# Patient Record
Sex: Female | Born: 1990 | Race: Black or African American | Hispanic: No | Marital: Single | Smoking: Never smoker
Health system: Southern US, Community
[De-identification: ages and names within clinical notes are randomized; demographics above are authoritative.]

## PROBLEM LIST (undated history)

## (undated) ENCOUNTER — Emergency Department (HOSPITAL_COMMUNITY): Payer: BC Managed Care – PPO

## (undated) DIAGNOSIS — G610 Guillain-Barre syndrome: Secondary | ICD-10-CM

## (undated) DIAGNOSIS — K529 Noninfective gastroenteritis and colitis, unspecified: Secondary | ICD-10-CM

## (undated) DIAGNOSIS — F411 Generalized anxiety disorder: Secondary | ICD-10-CM

## (undated) DIAGNOSIS — K219 Gastro-esophageal reflux disease without esophagitis: Secondary | ICD-10-CM

## (undated) DIAGNOSIS — E041 Nontoxic single thyroid nodule: Secondary | ICD-10-CM

## (undated) HISTORY — DX: Noninfective gastroenteritis and colitis, unspecified: K52.9

## (undated) HISTORY — DX: Generalized anxiety disorder: F41.1

## (undated) HISTORY — PX: WISDOM TOOTH EXTRACTION: SHX21

## (undated) HISTORY — DX: Nontoxic single thyroid nodule: E04.1

---

## 2017-03-26 ENCOUNTER — Emergency Department (HOSPITAL_COMMUNITY)
Admission: EM | Admit: 2017-03-26 | Discharge: 2017-03-27 | Disposition: A | Discharge status: Home / Self Care | Payer: Medicare Other | Attending: Emergency Medicine | Admitting: Emergency Medicine

## 2017-03-26 ENCOUNTER — Encounter (HOSPITAL_COMMUNITY): Payer: Self-pay | Admitting: Emergency Medicine

## 2017-03-26 DIAGNOSIS — T7802XA Anaphylactic reaction due to shellfish (crustaceans), initial encounter: Secondary | ICD-10-CM | POA: Diagnosis not present

## 2017-03-26 DIAGNOSIS — T782XXA Anaphylactic shock, unspecified, initial encounter: Secondary | ICD-10-CM

## 2017-03-26 DIAGNOSIS — Z79899 Other long term (current) drug therapy: Secondary | ICD-10-CM | POA: Insufficient documentation

## 2017-03-26 DIAGNOSIS — T781XXA Other adverse food reactions, not elsewhere classified, initial encounter: Secondary | ICD-10-CM | POA: Diagnosis present

## 2017-03-26 DIAGNOSIS — Z91013 Allergy to seafood: Secondary | ICD-10-CM | POA: Insufficient documentation

## 2017-03-26 MED ORDER — FAMOTIDINE 20 MG PO TABS
40.0000 mg | ORAL_TABLET | Freq: Once | ORAL | Status: AC
  Administered 2017-03-26: 40 mg via ORAL
  Filled 2017-03-26: qty 2

## 2017-03-26 MED ORDER — PREDNISONE 20 MG PO TABS
60.0000 mg | ORAL_TABLET | Freq: Once | ORAL | Status: AC
  Administered 2017-03-26: 60 mg via ORAL
  Filled 2017-03-26: qty 3

## 2017-03-26 MED ORDER — DIPHENHYDRAMINE HCL 25 MG PO CAPS
25.0000 mg | ORAL_CAPSULE | Freq: Once | ORAL | Status: AC
  Administered 2017-03-26: 25 mg via ORAL
  Filled 2017-03-26: qty 1

## 2017-03-26 MED ORDER — EPINEPHRINE 0.3 MG/0.3ML IJ SOAJ
0.3000 mg | Freq: Once | INTRAMUSCULAR | Status: AC
  Administered 2017-03-26: 0.3 mg via INTRAMUSCULAR
  Filled 2017-03-26: qty 0.3

## 2017-03-26 NOTE — ED Provider Notes (Signed)
[facility]-EMERGENCY DEPT Provider Note   CSN: [id number] Arrival date & time: 03/26/17  2218     History   Chief Complaint Chief Complaint  Patient presents with  . Allergic Reaction    HPI Taylor Stevens is a 26 y.o. female.  HPI  26 yo F with no significant PMHx here with facial swelling, sore throat, and difficulty swallowing. Pt reportedly was eating at a seafood restaurant and was eating shellfish today. She has never had this before but has had shrimp previously. She reportedly began developing a red rash around her mouth while eating followed by nausea and a sensation that her mouth was swelling and "closing off." No wheezing or SOB. No difficulty swallowing though she does have fullness in her throat. No vomiting but some nausea. No diarrhea. No h/o anaphylaxis or similar events.  History reviewed. No pertinent past medical history.  There are no active problems to display for this patient.   History reviewed. No pertinent surgical history.  OB History    No data available       Home Medications    Prior to Admission medications   Medication Sig Start Date End Date Taking? Authorizing Provider  diphenhydrAMINE (BENADRYL) 25 MG tablet Take 1 tablet (25 mg total) by mouth every 6 (six) hours. 03/27/17 04/01/17  Shaune Pollack, MD  EPINEPHrine (EPIPEN 2-PAK) 0.3 mg/0.3 mL IJ SOAJ injection Inject 0.3 mLs (0.3 mg total) into the muscle once as needed (anaphylaxis). 03/27/17   Shaune Pollack, MD  predniSONE (DELTASONE) 20 MG tablet Take 2 tablets (40 mg total) by mouth daily. 03/27/17 04/01/17  Shaune Pollack, MD  ranitidine (ZANTAC) 150 MG capsule Take 1 capsule (150 mg total) by mouth 2 (two) times daily. 03/27/17 04/01/17  Shaune Pollack, MD    Family History History reviewed. No pertinent family history.  Social History Social History  Substance Use Topics  . Smoking status: Never Smoker  . Smokeless tobacco: Never Used  . Alcohol use No     Allergies     Shellfish allergy   Review of Systems Review of Systems  HENT: Positive for trouble swallowing.   Gastrointestinal: Positive for nausea.  Skin: Positive for rash.  All other systems reviewed and are negative.    Physical Exam Updated Vital Signs BP 129/80 (BP Location: Left Arm)   Pulse 99   Temp 98.8 F (37.1 C) (Oral)   Resp 14   Ht 5\' 2"  (1.575 m)   Wt 63.5 kg (140 lb)   LMP 03/12/2017   SpO2 100%   BMI 25.61 kg/m   Physical Exam  Constitutional: She is oriented to person, place, and time. She appears well-developed and well-nourished. No distress.  HENT:  Head: Normocephalic and atraumatic.  Moderate bilateral periorbital edema. Moderate tonsillar edema and swelling.  Eyes: Conjunctivae are normal.  Neck: Neck supple.  No stridor.  Cardiovascular: Normal rate, regular rhythm and normal heart sounds.  Exam reveals no friction rub.   No murmur heard. Pulmonary/Chest: Effort normal and breath sounds normal. No respiratory distress. She has no wheezes. She has no rales.  Abdominal: She exhibits no distension.  Musculoskeletal: She exhibits no edema.  Neurological: She is alert and oriented to person, place, and time. She exhibits normal muscle tone.  Skin: Skin is warm. Capillary refill takes less than 2 seconds.  Psychiatric: She has a normal mood and affect.  Nursing note and vitals reviewed.    ED Treatments / Results  Labs (all labs ordered are listed, but  only abnormal results are displayed) Labs Reviewed - No data to display  EKG  EKG Interpretation None       Radiology No results found.  Procedures .Critical Care Performed by: Shaune PollackISAACS, Aftan Vint Authorized by: Shaune PollackISAACS, Allahna Husband      (including critical care time)  CRITICAL CARE Performed by: Dollene Clevelandameron Isascs   Total critical care time: 35 minutes  Critical care time was exclusive of separately billable procedures and treating other patients.  Critical care was necessary to treat or  prevent imminent or life-threatening deterioration.  Critical care was time spent personally by me on the following activities: development of treatment plan with patient and/or surrogate as well as nursing, discussions with consultants, evaluation of patient's response to treatment, examination of patient, obtaining history from patient or surrogate, ordering and performing treatments and interventions, ordering and review of laboratory studies, ordering and review of radiographic studies, pulse oximetry and re-evaluation of patient's condition.    Medications Ordered in ED Medications  EPINEPHrine (EPI-PEN) injection 0.3 mg (0.3 mg Intramuscular Given 03/26/17 2357)  diphenhydrAMINE (BENADRYL) capsule 25 mg (25 mg Oral Given 03/26/17 2357)  famotidine (PEPCID) tablet 40 mg (40 mg Oral Given 03/26/17 2357)  predniSONE (DELTASONE) tablet 60 mg (60 mg Oral Given 03/26/17 2357)     Initial Impression / Assessment and Plan / ED Course  I have reviewed the triage vital signs and the nursing notes.  Pertinent labs & imaging results that were available during my care of the patient were reviewed by me and considered in my medical decision making (see chart for details).    26 yo F with PMHx as above here with oral swelling, mild nausea after eating shellfish. Suspect allergic reaction, possible early anaphylaxis due to shellfish exposure. Pt given IM epi, PO meds with improvement. Plan to monitor in ED for rebound, d/c with outpt follow-up. Discussed avoidance of shellfish with pt. Plan to monitor until 2-3 AM, d/c if improved and stable. Pt aware.   Final Clinical Impressions(s) / ED Diagnoses   Final diagnoses:  Anaphylaxis, initial encounter  Shellfish allergy    New Prescriptions New Prescriptions   DIPHENHYDRAMINE (BENADRYL) 25 MG TABLET    Take 1 tablet (25 mg total) by mouth every 6 (six) hours.   EPINEPHRINE (EPIPEN 2-PAK) 0.3 MG/0.3 ML IJ SOAJ INJECTION    Inject 0.3 mLs (0.3 mg  total) into the muscle once as needed (anaphylaxis).   PREDNISONE (DELTASONE) 20 MG TABLET    Take 2 tablets (40 mg total) by mouth daily.   RANITIDINE (ZANTAC) 150 MG CAPSULE    Take 1 capsule (150 mg total) by mouth 2 (two) times daily.     Shaune PollackIsaacs, Sherlock Nancarrow, MD 03/27/17 [phone]

## 2017-03-26 NOTE — ED Triage Notes (Signed)
Patient was eating shellfish. Patient started to feel her throat swell. Eyes are swelling. Patient has taken 50 mg of benadryl by mouth. Patient is not having trouble breathing.

## 2017-03-27 MED ORDER — DIPHENHYDRAMINE HCL 25 MG PO TABS: 25.0000 mg | ORAL_TABLET | Freq: Four times a day (QID) | ORAL | 0 refills | Status: DC

## 2017-03-27 MED ORDER — EPINEPHRINE 0.3 MG/0.3ML IJ SOAJ: 0.3000 mg | Freq: Once | INTRAMUSCULAR | 0 refills | Status: DC | PRN

## 2017-03-27 MED ORDER — PREDNISONE 20 MG PO TABS: 40.0000 mg | ORAL_TABLET | Freq: Every day | ORAL | 0 refills | Status: AC

## 2017-03-27 MED ORDER — RANITIDINE HCL 150 MG PO CAPS: 150.0000 mg | ORAL_CAPSULE | Freq: Two times a day (BID) | ORAL | 0 refills | Status: DC

## 2017-03-29 ENCOUNTER — Emergency Department (HOSPITAL_BASED_OUTPATIENT_CLINIC_OR_DEPARTMENT_OTHER)
Admission: EM | Admit: 2017-03-29 | Discharge: 2017-03-29 | Disposition: A | Discharge status: Home / Self Care | Payer: Medicare Other | Attending: Emergency Medicine | Admitting: Emergency Medicine

## 2017-03-29 ENCOUNTER — Encounter (HOSPITAL_BASED_OUTPATIENT_CLINIC_OR_DEPARTMENT_OTHER): Payer: Self-pay

## 2017-03-29 DIAGNOSIS — R070 Pain in throat: Secondary | ICD-10-CM | POA: Diagnosis present

## 2017-03-29 DIAGNOSIS — K208 Other esophagitis: Secondary | ICD-10-CM | POA: Insufficient documentation

## 2017-03-29 DIAGNOSIS — T50905A Adverse effect of unspecified drugs, medicaments and biological substances, initial encounter: Secondary | ICD-10-CM

## 2017-03-29 NOTE — ED Triage Notes (Addendum)
C/o feeling like "throat swelling after taking prednisone" 20 min PTA-pt NAD-taking prednisone for recent allergic reaction to shellfish

## 2017-03-29 NOTE — ED Provider Notes (Signed)
[facility]   CSN: [id number] Arrival date & time: 03/29/17  1703   By signing my name below, I, Taylor Stevens, attest that this documentation has been prepared under the direction and in the presence of Rolland PorterJames, Cleave Ternes, MD . Electronically Signed: Freida Busmaniana Stevens, Scribe. 03/29/2017. 5:30 PM.   History   Chief Complaint Chief Complaint  Patient presents with  . Oral Swelling    The history is provided by the patient. No language interpreter was used.     HPI Comments:  Taylor Stevens is a 26 y.o. female who presents to the Emergency Department complaining of mild discomfort  in her throat that began ~ 30 min PTA after she took a prednisone tablet today. She felt like the pill "got stuck" Pt was seen in the ED on 03/26/2017 for an allergic reaction. She presented at that time with trouble swallowing, rash, and nausea after eating shellfish. She was discharged with prednisone which she has been taking since discharge. She denies rash today and notes throat swelling has improved at this time. Pt has no other acute complaints or associated symptoms at this time.   History reviewed. No pertinent past medical history.  There are no active problems to display for this patient.   History reviewed. No pertinent surgical history.  OB History    No data available       Home Medications    Prior to Admission medications   Medication Sig Start Date End Date Taking? Authorizing Provider  diphenhydrAMINE (BENADRYL) 25 MG tablet Take 1 tablet (25 mg total) by mouth every 6 (six) hours. 03/27/17 04/01/17  Shaune PollackIsaacs, Cameron, MD  EPINEPHrine (EPIPEN 2-PAK) 0.3 mg/0.3 mL IJ SOAJ injection Inject 0.3 mLs (0.3 mg total) into the muscle once as needed (anaphylaxis). 03/27/17   Shaune PollackIsaacs, Cameron, MD  predniSONE (DELTASONE) 20 MG tablet Take 2 tablets (40 mg total) by mouth daily. 03/27/17 04/01/17  Shaune PollackIsaacs, Cameron, MD  ranitidine (ZANTAC) 150 MG capsule Take 1 capsule (150 mg total) by  mouth 2 (two) times daily. 03/27/17 04/01/17  Shaune PollackIsaacs, Cameron, MD    Family History No family history on file.  Social History Social History  Substance Use Topics  . Smoking status: Never Smoker  . Smokeless tobacco: Never Used  . Alcohol use No     Allergies   Shellfish allergy   Review of Systems Review of Systems  Constitutional: Negative for appetite change, chills, diaphoresis, fatigue and fever.  HENT: Negative for mouth sores, sore throat and trouble swallowing.        +Throat swelling  Eyes: Negative for visual disturbance.  Respiratory: Negative for cough, chest tightness, shortness of breath and wheezing.   Cardiovascular: Negative for chest pain.  Gastrointestinal: Negative for abdominal distention, abdominal pain, diarrhea, nausea and vomiting.  Endocrine: Negative for polydipsia, polyphagia and polyuria.  Genitourinary: Negative for dysuria, frequency and hematuria.  Musculoskeletal: Negative for gait problem.  Skin: Negative for color change, pallor and rash.  Neurological: Negative for dizziness, syncope, light-headedness and headaches.  Hematological: Does not bruise/bleed easily.  Psychiatric/Behavioral: Negative for behavioral problems and confusion.     Physical Exam Updated Vital Signs BP 112/73 (BP Location: Left Arm)   Pulse 90   Temp 98.3 F (36.8 C) (Oral)   Resp 18   Ht 5\' 2"  (1.575 m)   Wt 63.5 kg (140 lb)   LMP 03/12/2017   SpO2 99%   BMI 25.61 kg/m   Physical Exam  Constitutional: She is oriented  to person, place, and time. She appears well-developed and well-nourished. No distress.  HENT:  Head: Normocephalic.  Eyes: Conjunctivae are normal. Pupils are equal, round, and reactive to light. No scleral icterus.  Neck: Normal range of motion. Neck supple. No thyromegaly present.  Cardiovascular: Normal rate and regular rhythm.  Exam reveals no gallop and no friction rub.   No murmur heard. Pulmonary/Chest: Effort normal and breath  sounds normal. No respiratory distress. She has no wheezes. She has no rales.  Abdominal: Soft. Bowel sounds are normal. She exhibits no distension. There is no tenderness. There is no rebound.  Musculoskeletal: Normal range of motion.  Neurological: She is alert and oriented to person, place, and time.  Skin: Skin is warm and dry. No rash noted.  Psychiatric: She has a normal mood and affect. Her behavior is normal.     ED Treatments / Results  DIAGNOSTIC STUDIES:  Oxygen Saturation is 99% on RA, normal by my interpretation.    COORDINATION OF CARE:  5:30 PM Discussed treatment plan with pt at bedside and pt agreed to plan.  Labs (all labs ordered are listed, but only abnormal results are displayed) Labs Reviewed - No data to display  EKG  EKG Interpretation None       Radiology No results found.  Procedures Procedures (including critical care time)  Medications Ordered in ED Medications - No data to display   Initial Impression / Assessment and Plan / ED Course  I have reviewed the triage vital signs and the nursing notes.  Pertinent labs & imaging results that were available during my care of the patient were reviewed by me and considered in my medical decision making (see chart for details).     Normal exam. History c/w pill esophagitis.   Final Clinical Impressions(s) / ED Diagnoses   Final diagnoses:  Pill esophagitis    New Prescriptions New Prescriptions   No medications on file       Rolland Porter, MD 03/29/17 1742

## 2017-03-29 NOTE — Discharge Instructions (Signed)
Soft diet. Avoid chips, etc. Avoid salsa, and other acidic foods.  Stop prednisone.

## 2017-04-06 ENCOUNTER — Emergency Department (HOSPITAL_BASED_OUTPATIENT_CLINIC_OR_DEPARTMENT_OTHER)
Admission: EM | Admit: 2017-04-06 | Discharge: 2017-04-06 | Disposition: A | Discharge status: Home / Self Care | Payer: Medicare Other | Attending: Emergency Medicine | Admitting: Emergency Medicine

## 2017-04-06 ENCOUNTER — Encounter (HOSPITAL_BASED_OUTPATIENT_CLINIC_OR_DEPARTMENT_OTHER): Payer: Self-pay | Admitting: Emergency Medicine

## 2017-04-06 DIAGNOSIS — T7840XA Allergy, unspecified, initial encounter: Secondary | ICD-10-CM | POA: Diagnosis not present

## 2017-04-06 DIAGNOSIS — R21 Rash and other nonspecific skin eruption: Secondary | ICD-10-CM | POA: Diagnosis present

## 2017-04-06 NOTE — ED Provider Notes (Signed)
[facility]   CSN: [id number] Arrival date & time: 04/06/17  0901     History   Chief Complaint Chief Complaint  Patient presents with  . Rash    HPI Taylor Stevens is a 26 y.o. female.  HPI Patient presents to the emergency department with complaints of rash to bilateral arms as well as left upper eyelid.  She states this is been present since Monday.  There's been no progression.  No fevers or chills.  Some itching.  Mild swelling noted of the left periorbital region which is why she presented to the ER today.  No difficulty breathing.  No other complaints.   History reviewed. No pertinent past medical history.  There are no active problems to display for this patient.   History reviewed. No pertinent surgical history.  OB History    No data available       Home Medications    Prior to Admission medications   Medication Sig Start Date End Date Taking? Authorizing Provider  diphenhydrAMINE (BENADRYL) 25 MG tablet Take 1 tablet (25 mg total) by mouth every 6 (six) hours. 03/27/17 04/01/17  Shaune PollackIsaacs, Cameron, MD  EPINEPHrine (EPIPEN 2-PAK) 0.3 mg/0.3 mL IJ SOAJ injection Inject 0.3 mLs (0.3 mg total) into the muscle once as needed (anaphylaxis). 03/27/17   Shaune PollackIsaacs, Cameron, MD  ranitidine (ZANTAC) 150 MG capsule Take 1 capsule (150 mg total) by mouth 2 (two) times daily. 03/27/17 04/01/17  Shaune PollackIsaacs, Cameron, MD    Family History No family history on file.  Social History Social History  Substance Use Topics  . Smoking status: Never Smoker  . Smokeless tobacco: Never Used  . Alcohol use No     Allergies   Shellfish allergy   Review of Systems Review of Systems  All other systems reviewed and are negative.    Physical Exam Updated Vital Signs BP 99/65 (BP Location: Left Arm)   Pulse 87   Temp 98.2 F (36.8 C) (Oral)   Resp 16   Ht 5\' 2"  (1.575 m)   Wt 63.5 kg (140 lb)   LMP 03/12/2017   SpO2 100%   BMI 25.61 kg/m   Physical  Exam  Constitutional: She is oriented to person, place, and time. She appears well-developed and well-nourished.  HENT:  Head: Normocephalic.  Macular rash on left upper eyelid with mild swelling. EOM normal. Left eye and conjunctiva normal  Eyes: EOM are normal.  Neck: Normal range of motion.  Pulmonary/Chest: Effort normal.  Abdominal: She exhibits no distension.  Musculoskeletal: Normal range of motion.  Neurological: She is alert and oriented to person, place, and time.  Skin:  Small areas of flat macular rash on bilateral arms and left upper eyelid  Psychiatric: She has a normal mood and affect.  Nursing note and vitals reviewed.    ED Treatments / Results  Labs (all labs ordered are listed, but only abnormal results are displayed) Labs Reviewed - No data to display  EKG  EKG Interpretation None       Radiology No results found.  Procedures Procedures (including critical care time)  Medications Ordered in ED Medications - No data to display   Initial Impression / Assessment and Plan / ED Course  I have reviewed the triage vital signs and the nursing notes.  Pertinent labs & imaging results that were available during my care of the patient were reviewed by me and considered in my medical decision making (see chart for details).  Likely allergic in nature.  Recommend hydrocortisone cream.  Patient was likely rubbing the area at night last night which is why it is slightly more swollen.  No signs suggest periorbital cellulitis.  Doubt [facility] spotted fever.  Home with topical steroids  Final Clinical Impressions(s) / ED Diagnoses   Final diagnoses:  Allergic reaction, initial encounter    New Prescriptions New Prescriptions   No medications on file     Azalia Bilis, MD 04/06/17 [phone]

## 2017-04-06 NOTE — ED Triage Notes (Signed)
Pt c/o LT eye edema with redness and sts noticed vision in that eye seems blurry; also noticed several red, round spots on BUE; sts went hiking over the weekend and noticed these on Mon. Pt also c/o intermittent sharp RT rib pain x months.

## 2017-04-10 ENCOUNTER — Inpatient Hospital Stay: Payer: Medicare Other

## 2017-04-16 ENCOUNTER — Encounter (HOSPITAL_BASED_OUTPATIENT_CLINIC_OR_DEPARTMENT_OTHER): Payer: Self-pay

## 2017-04-16 ENCOUNTER — Emergency Department (HOSPITAL_BASED_OUTPATIENT_CLINIC_OR_DEPARTMENT_OTHER)
Admission: EM | Admit: 2017-04-16 | Discharge: 2017-04-16 | Disposition: A | Discharge status: Home / Self Care | Payer: Medicare Other | Attending: Emergency Medicine | Admitting: Emergency Medicine

## 2017-04-16 DIAGNOSIS — M545 Low back pain: Secondary | ICD-10-CM | POA: Diagnosis present

## 2017-04-16 DIAGNOSIS — S39012A Strain of muscle, fascia and tendon of lower back, initial encounter: Secondary | ICD-10-CM

## 2017-04-16 MED ORDER — NAPROXEN 500 MG PO TABS: 500.0000 mg | ORAL_TABLET | Freq: Two times a day (BID) | ORAL | 0 refills | Status: DC

## 2017-04-16 MED ORDER — CYCLOBENZAPRINE HCL 10 MG PO TABS: 10.0000 mg | ORAL_TABLET | Freq: Two times a day (BID) | ORAL | 0 refills | Status: DC | PRN

## 2017-04-16 NOTE — ED Triage Notes (Signed)
Pt reports left lower back pain since yesterday while at work. Pain with twisting and moving.

## 2017-04-16 NOTE — Discharge Instructions (Signed)
Naproxen as prescribed.  Flexeril as prescribed as needed for pain not relieved with naproxen.  Follow up with your primary Dr. if not improving in the next 1-2 weeks to discuss physical therapy or imaging studies.

## 2017-04-16 NOTE — ED Provider Notes (Signed)
[facility]   CSN: [id number] Arrival date & time: 04/16/17  1607     History   Chief Complaint Chief Complaint  Patient presents with  . Back Pain    HPI Taylor Stevens is a 26 y.o. female.  Patient is a 26 year old female with no significant past medical history presenting with complaints of low back pain. She reports lifting a heavy box yesterday at work and feeling a tear in her left lower back. She has been experiencing pain since. She denies any radiation of her pain into her legs. She denies any bowel or bladder complaints. She denies any numbness or tingling.   The history is provided by the patient.  Back Pain   This is a new problem. The current episode started yesterday. The problem occurs constantly. The problem has not changed since onset.The pain is associated with lifting heavy objects. The pain is present in the lumbar spine. The quality of the pain is described as stabbing. The pain does not radiate. The pain is moderate. The symptoms are aggravated by bending, twisting and certain positions. Pertinent negatives include no bowel incontinence, no bladder incontinence, no paresthesias, no tingling and no weakness. She has tried nothing for the symptoms. The treatment provided no relief.    History reviewed. No pertinent past medical history.  There are no active problems to display for this patient.   History reviewed. No pertinent surgical history.  OB History    No data available       Home Medications    Prior to Admission medications   Medication Sig Start Date End Date Taking? Authorizing Provider  diphenhydrAMINE (BENADRYL) 25 MG tablet Take 1 tablet (25 mg total) by mouth every 6 (six) hours. 03/27/17 04/01/17  Shaune Pollack, MD  EPINEPHrine (EPIPEN 2-PAK) 0.3 mg/0.3 mL IJ SOAJ injection Inject 0.3 mLs (0.3 mg total) into the muscle once as needed (anaphylaxis). 03/27/17   Shaune Pollack, MD  ranitidine (ZANTAC) 150 MG  capsule Take 1 capsule (150 mg total) by mouth 2 (two) times daily. 03/27/17 04/01/17  Shaune Pollack, MD    Family History No family history on file.  Social History Social History  Substance Use Topics  . Smoking status: Never Smoker  . Smokeless tobacco: Never Used  . Alcohol use No     Allergies   Shellfish allergy   Review of Systems Review of Systems  Gastrointestinal: Negative for bowel incontinence.  Genitourinary: Negative for bladder incontinence.  Musculoskeletal: Positive for back pain.  Neurological: Negative for tingling, weakness and paresthesias.  All other systems reviewed and are negative.    Physical Exam Updated Vital Signs BP 108/71 (BP Location: Left Arm)   Pulse 74   Temp 98.5 F (36.9 C) (Oral)   Resp 18   LMP 04/12/2017   SpO2 100%   Physical Exam  Constitutional: She is oriented to person, place, and time. She appears well-developed and well-nourished. No distress.  HENT:  Head: Normocephalic and atraumatic.  Neck: Normal range of motion. Neck supple.  Musculoskeletal:  There is tenderness to palpation in the soft tissues of the left lower lumbar region. There is no bony tenderness.  Neurological: She is alert and oriented to person, place, and time.  DTRs are 2+ and symmetrical in the patellar and Achilles tendons bilaterally. Strength is 5 out of 5 in both lower extremities. She is able to ambulate on heels and toes without difficulty.  Skin: Skin is warm and dry. She is not  diaphoretic.  Nursing note and vitals reviewed.    ED Treatments / Results  Labs (all labs ordered are listed, but only abnormal results are displayed) Labs Reviewed - No data to display  EKG  EKG Interpretation None       Radiology No results found.  Procedures Procedures (including critical care time)  Medications Ordered in ED Medications - No data to display   Initial Impression / Assessment and Plan / ED Course  I have reviewed the triage  vital signs and the nursing notes.  Pertinent labs & imaging results that were available during my care of the patient were reviewed by me and considered in my medical decision making (see chart for details).  No evidence for thought Aquinas or other emergent situation. She will be treated with anti-inflammatories, muscle relaxers, and follow-up as needed if not improving.  Final Clinical Impressions(s) / ED Diagnoses   Final diagnoses:  None    New Prescriptions New Prescriptions   No medications on file     Geoffery Lyonselo, Bertel Venard, MD 04/16/17 [phone]

## 2017-04-16 NOTE — ED Notes (Signed)
ED Provider at bedside. 

## 2017-05-15 ENCOUNTER — Emergency Department (HOSPITAL_BASED_OUTPATIENT_CLINIC_OR_DEPARTMENT_OTHER)
Admission: EM | Admit: 2017-05-15 | Discharge: 2017-05-15 | Disposition: A | Discharge status: Home / Self Care | Payer: Medicare Other | Attending: Emergency Medicine | Admitting: Emergency Medicine

## 2017-05-15 ENCOUNTER — Encounter (HOSPITAL_BASED_OUTPATIENT_CLINIC_OR_DEPARTMENT_OTHER): Payer: Self-pay | Admitting: *Deleted

## 2017-05-15 DIAGNOSIS — R0789 Other chest pain: Secondary | ICD-10-CM | POA: Insufficient documentation

## 2017-05-15 DIAGNOSIS — R079 Chest pain, unspecified: Secondary | ICD-10-CM | POA: Diagnosis present

## 2017-05-15 DIAGNOSIS — Z79899 Other long term (current) drug therapy: Secondary | ICD-10-CM | POA: Diagnosis not present

## 2017-05-15 DIAGNOSIS — R002 Palpitations: Secondary | ICD-10-CM | POA: Diagnosis not present

## 2017-05-15 MED ORDER — DOCUSATE SODIUM 100 MG PO CAPS: 100.0000 mg | ORAL_CAPSULE | Freq: Two times a day (BID) | ORAL | 0 refills | Status: DC

## 2017-05-15 MED ORDER — OMEPRAZOLE 20 MG PO CPDR: 20.0000 mg | DELAYED_RELEASE_CAPSULE | Freq: Every day | ORAL | 1 refills | Status: DC

## 2017-05-15 MED FILL — OMEPRAZOLE DR 20 MG CAPSULE: 20 | 30 days supply | Qty: 30 | Fill #0

## 2017-05-15 MED FILL — DOK 100 MG SOFTGEL: 100 | 50 days supply | Qty: 100 | Fill #0

## 2017-05-15 NOTE — ED Provider Notes (Signed)
[facility]   CSN: [id number] Arrival date & time: 05/15/17  1209     History   Chief Complaint Chief Complaint  Patient presents with  . Chest Pain    HPI Taylor Stevens is a 26 y.o. female.  HPI Patient has a number of different symptoms. She reports that she often believes this is due to her gas. She reports she is always been like that. She reports now though she notes that she gets pains on both sides of her lower chest and the back. They may migrate around. Sometimes she might get a sharp pain in her anterior chest. Other times pains might be somewhat up in the left upper quadrant or occasionally in the right upper quadrant. They're usually crampy or sharp in nature. Patient reports for 2 weeks she's been noting that sometimes her heart feels like it skips beats. It feels like he'll get an extra little jump in. No syncope or near syncope. No fever no cough. No lower extremity swelling. Patient denies constipation but does note that her bowel movement was so big the other day she had to push and it did hurt when it finally came out and there was a little bit of bright red blood. As far as the patient's diet is concerned, she reports she eats a little bit of everything. She reports she does eat a lot of spicy food. History reviewed. No pertinent past medical history.  There are no active problems to display for this patient.   History reviewed. No pertinent surgical history.  OB History    No data available       Home Medications    Prior to Admission medications   Medication Sig Start Date End Date Taking? Authorizing Provider  cyclobenzaprine (FLEXERIL) 10 MG tablet Take 1 tablet (10 mg total) by mouth 2 (two) times daily as needed for muscle spasms. 04/16/17   Geoffery Lyonselo, Douglas, MD  diphenhydrAMINE (BENADRYL) 25 MG tablet Take 1 tablet (25 mg total) by mouth every 6 (six) hours. 03/27/17 04/01/17  Shaune PollackIsaacs, Cameron, MD  docusate sodium (COLACE) 100 MG  capsule Take 1 capsule (100 mg total) by mouth every 12 (twelve) hours. 05/15/17   Arby BarrettePfeiffer, Mcdaniel Ohms, MD  EPINEPHrine (EPIPEN 2-PAK) 0.3 mg/0.3 mL IJ SOAJ injection Inject 0.3 mLs (0.3 mg total) into the muscle once as needed (anaphylaxis). 03/27/17   Shaune PollackIsaacs, Cameron, MD  naproxen (NAPROSYN) 500 MG tablet Take 1 tablet (500 mg total) by mouth 2 (two) times daily. 04/16/17   Geoffery Lyonselo, Douglas, MD  omeprazole (PRILOSEC) 20 MG capsule Take 1 capsule (20 mg total) by mouth daily. 05/15/17   Arby BarrettePfeiffer, Shahla Betsill, MD  ranitidine (ZANTAC) 150 MG capsule Take 1 capsule (150 mg total) by mouth 2 (two) times daily. 03/27/17 04/01/17  Shaune PollackIsaacs, Cameron, MD    Family History No family history on file.  Social History Social History  Substance Use Topics  . Smoking status: Never Smoker  . Smokeless tobacco: Never Used  . Alcohol use No     Allergies   Shellfish allergy   Review of Systems Review of Systems 10 Systems reviewed and are negative for acute change except as noted in the HPI.   Physical Exam Updated Vital Signs BP 106/79   Pulse 77   Temp 98.3 F (36.8 C) (Oral)   Resp 16   Ht 5\' 2"  (1.575 m)   Wt 63.5 kg (140 lb)   SpO2 100%   BMI 25.61 kg/m   Physical Exam  Constitutional: She is oriented to person, place, and time. She appears well-developed and well-nourished. No distress.  HENT:  Head: Normocephalic and atraumatic.  Mouth/Throat: Oropharynx is clear and moist.  Eyes: Conjunctivae and EOM are normal.  Neck: Neck supple.  Cardiovascular: Normal rate, regular rhythm, normal heart sounds and intact distal pulses.   No murmur heard. Pulmonary/Chest: Effort normal and breath sounds normal. No respiratory distress.  Abdominal: Soft. Bowel sounds are normal. She exhibits no distension. There is no tenderness. There is no guarding.  Musculoskeletal: Normal range of motion. She exhibits no edema or tenderness.  Neurological: She is alert and oriented to person, place, and time. No cranial  nerve deficit. She exhibits normal muscle tone. Coordination normal.  Skin: Skin is warm and dry.  Psychiatric: She has a normal mood and affect.  Nursing note and vitals reviewed.    ED Treatments / Results  Labs (all labs ordered are listed, but only abnormal results are displayed) Labs Reviewed - No data to display  EKG  EKG Interpretation  Date/Time:  Monday May 15 2017 12:22:25 EDT Ventricular Rate:  86 PR Interval:  186 QRS Duration: 64 QT Interval:  348 QTC Calculation: 416 R Axis:   71 Text Interpretation:  Normal sinus rhythm Normal ECG Confirmed by Arby Barrette [phone]) on 05/15/2017 1:11:44 PM       Radiology No results found.  Procedures Procedures (including critical care time)  Medications Ordered in ED Medications - No data to display   Initial Impression / Assessment and Plan / ED Course  I have reviewed the triage vital signs and the nursing notes.  Pertinent labs & imaging results that were available during my care of the patient were reviewed by me and considered in my medical decision making (see chart for details).      Final Clinical Impressions(s) / ED Diagnoses   Final diagnoses:  Atypical chest pain  Palpitation   Patient has multiple symptoms as outlined. At this time I have low suspicion for cardiac or pulmonary etiology. Cardiac exam is normal. Patient does perceive intermittent palpitation. Instructions are provided for dietary and lifestyle measures for palpitations. With the patient's variable intermittent upper abdominal and lower chest discomfort I suspect GERD or gastritis. I have counseled the patient on daily Prilosec). Also she may have constipation. Colace is advised. Patient is counseled to follow-up with a primary care physician. She reports she wishes to do this and will use referral number to find one. New Prescriptions New Prescriptions   DOCUSATE SODIUM (COLACE) 100 MG CAPSULE    Take 1 capsule (100 mg total) by  mouth every 12 (twelve) hours.   OMEPRAZOLE (PRILOSEC) 20 MG CAPSULE    Take 1 capsule (20 mg total) by mouth daily.     Arby Barrette, MD 05/15/17 1328

## 2017-05-15 NOTE — ED Triage Notes (Signed)
Fluttering in her chest x 2 weeks. States it is worse at night when she lays down.

## 2017-05-22 ENCOUNTER — Encounter (HOSPITAL_BASED_OUTPATIENT_CLINIC_OR_DEPARTMENT_OTHER): Payer: Self-pay

## 2017-05-22 ENCOUNTER — Emergency Department (HOSPITAL_BASED_OUTPATIENT_CLINIC_OR_DEPARTMENT_OTHER)
Admission: EM | Admit: 2017-05-22 | Discharge: 2017-05-22 | Disposition: A | Discharge status: Home / Self Care | Payer: Medicare Other | Attending: Emergency Medicine | Admitting: Emergency Medicine

## 2017-05-22 DIAGNOSIS — N760 Acute vaginitis: Secondary | ICD-10-CM | POA: Diagnosis not present

## 2017-05-22 DIAGNOSIS — B373 Candidiasis of vulva and vagina: Secondary | ICD-10-CM

## 2017-05-22 DIAGNOSIS — B379 Candidiasis, unspecified: Secondary | ICD-10-CM | POA: Diagnosis not present

## 2017-05-22 DIAGNOSIS — N898 Other specified noninflammatory disorders of vagina: Secondary | ICD-10-CM | POA: Diagnosis present

## 2017-05-22 DIAGNOSIS — B9689 Other specified bacterial agents as the cause of diseases classified elsewhere: Secondary | ICD-10-CM

## 2017-05-22 DIAGNOSIS — B3731 Acute candidiasis of vulva and vagina: Secondary | ICD-10-CM

## 2017-05-22 LAB — WET PREP, GENITAL
SPERM: NONE SEEN
TRICH WET PREP: NONE SEEN

## 2017-05-22 LAB — URINALYSIS, ROUTINE W REFLEX MICROSCOPIC
Bilirubin Urine: NEGATIVE
GLUCOSE, UA: NEGATIVE mg/dL
HGB URINE DIPSTICK: NEGATIVE
KETONES UR: NEGATIVE mg/dL
LEUKOCYTES UA: NEGATIVE
Nitrite: NEGATIVE
PH: 6 (ref 5.0–8.0)
PROTEIN: NEGATIVE mg/dL
Specific Gravity, Urine: 1.015 (ref 1.005–1.030)

## 2017-05-22 LAB — PREGNANCY, URINE: Preg Test, Ur: NEGATIVE

## 2017-05-22 MED ORDER — DIPHENHYDRAMINE HCL 50 MG/ML IJ SOLN
25.0000 mg | Freq: Once | INTRAMUSCULAR | Status: DC
  Filled 2017-05-22: qty 1

## 2017-05-22 MED ORDER — METOCLOPRAMIDE HCL 5 MG/ML IJ SOLN
10.0000 mg | Freq: Once | INTRAMUSCULAR | Status: DC
  Filled 2017-05-22: qty 2

## 2017-05-22 MED ORDER — METRONIDAZOLE 500 MG PO TABS: 500.0000 mg | ORAL_TABLET | Freq: Two times a day (BID) | ORAL | 0 refills | Status: AC

## 2017-05-22 MED ORDER — FLUCONAZOLE 50 MG PO TABS
150.0000 mg | ORAL_TABLET | Freq: Once | ORAL | Status: AC
  Administered 2017-05-22: 14:00:00 150 mg via ORAL
  Filled 2017-05-22: qty 1

## 2017-05-22 MED ORDER — SODIUM CHLORIDE 0.9 % IV BOLUS (SEPSIS)
500.0000 mL | Freq: Once | INTRAVENOUS | Status: AC
  Administered 2017-05-22: 500 mL via INTRAVENOUS

## 2017-05-22 MED FILL — metroNIDAZOLE 500 MG TABS: 500 | 7 days supply | Qty: 14 | Fill #0

## 2017-05-22 NOTE — ED Triage Notes (Signed)
C/o vaginal d/c x 1 week-NAD-steady gait 

## 2017-05-22 NOTE — Discharge Instructions (Signed)
As discussed, you will receive a call if any results return positive from culture. Avoid sexual intercourse for at least 7 days. Take your entire course of antibiotics even if you feel better. Follow-up with your primary care provider or GYN.   Return if you experience any new concerning symptoms in the meantime.

## 2017-05-22 NOTE — ED Provider Notes (Signed)
[facility]   CSN: [id number] Arrival date & time: 05/22/17  1159     History   Chief Complaint Chief Complaint  Patient presents with  . Vaginal Discharge    HPI Taylor Stevens is a 26 y.o. female presents with pruritus and vaginal discharge as main concern. She also reports progressive onset left-sided headache starting today and reports she does not normally have headaches and was concerned. She has not tried anything for this. It is sharp and intermittent. One week of white thick vaginal discharge. IUD in place. History of unprotected intercourse. She reports history of multiple infections BV. He has tried Monistat for 3 days without success. LMP 2 days ago Denies fever, chills, neck stiffness, blurry vision, dizziness, nausea, vomiting. HPI  History reviewed. No pertinent past medical history.  There are no active problems to display for this patient.   History reviewed. No pertinent surgical history.  OB History    No data available       Home Medications    Prior to Admission medications   Medication Sig Start Date End Date Taking? Authorizing Provider  diphenhydrAMINE (BENADRYL) 25 MG tablet Take 1 tablet (25 mg total) by mouth every 6 (six) hours. 03/27/17 04/01/17  Shaune Pollack, MD  docusate sodium (COLACE) 100 MG capsule Take 1 capsule (100 mg total) by mouth every 12 (twelve) hours. 05/15/17   Arby Barrette, MD  metroNIDAZOLE (FLAGYL) 500 MG tablet Take 1 tablet (500 mg total) by mouth 2 (two) times daily. 05/22/17 05/29/17  Georgiana Shore, PA-C  omeprazole (PRILOSEC) 20 MG capsule Take 1 capsule (20 mg total) by mouth daily. 05/15/17   Arby Barrette, MD    Family History No family history on file.  Social History Social History  Substance Use Topics  . Smoking status: Never Smoker  . Smokeless tobacco: Never Used  . Alcohol use No     Allergies   Shellfish allergy   Review of Systems Review of Systems    Constitutional: Negative for chills and fever.  HENT: Negative for facial swelling, sinus pain and sinus pressure.   Eyes: Negative for photophobia, redness and visual disturbance.  Respiratory: Negative for cough, shortness of breath, wheezing and stridor.   Cardiovascular: Negative for chest pain and palpitations.  Gastrointestinal: Negative for abdominal distention, abdominal pain, nausea and vomiting.  Genitourinary: Positive for vaginal discharge. Negative for difficulty urinating, dysuria, flank pain, frequency, hematuria, pelvic pain, urgency and vaginal pain.  Musculoskeletal: Negative for arthralgias, back pain, gait problem, joint swelling, myalgias, neck pain and neck stiffness.  Skin: Negative for color change, pallor and rash.  Neurological: Positive for light-headedness and headaches. Negative for dizziness, tremors, seizures, syncope, facial asymmetry, speech difficulty, weakness and numbness.  All other systems reviewed and are negative.    Physical Exam Updated Vital Signs BP 100/82   Pulse 75   Temp 98.7 F (37.1 C) (Oral)   Resp 16   Ht 5\' 2"  (1.575 m)   Wt 73.1 kg (161 lb 2 oz)   SpO2 100%   BMI 29.47 kg/m   Physical Exam  Constitutional: She is oriented to person, place, and time. She appears well-developed and well-nourished. No distress.  Afebrile, nontoxic-appearing, lying comfortably in bed distress.  HENT:  Head: Normocephalic and atraumatic.  Mouth/Throat: Oropharynx is clear and moist. No oropharyngeal exudate.  Eyes: Pupils are equal, round, and reactive to light. Conjunctivae and EOM are normal. Right eye exhibits no discharge. Left eye exhibits no discharge.  Neck: Normal range of motion. Neck supple.  Negative Meningeal signs  Cardiovascular: Normal rate, regular rhythm, normal heart sounds and intact distal pulses.   No murmur heard. Pulmonary/Chest: Effort normal and breath sounds normal. No respiratory distress. She has no wheezes. She has no  rales.  Abdominal: Soft. She exhibits no distension and no mass. There is no tenderness. There is no rebound and no guarding.  Genitourinary: Vaginal discharge found.  Genitourinary Comments: No cervical motion tenderness, no adnexal tenderness or mass, thick white discharge.   Musculoskeletal: Normal range of motion. She exhibits no edema.  Neurological: She is alert and oriented to person, place, and time. No cranial nerve deficit or sensory deficit. She exhibits normal muscle tone. Coordination normal.  Neurologic Exam:  - Mental status: Patient is alert and cooperative. Fluent speech and words are clear. Coherent thought processes and insight is good. Patient is oriented x 4 to person, place, time and event.  - Cranial nerves:  CN III, IV, VI: pupils equally round, reactive to light both direct and conscensual. Full extra-ocular movement. CN V: motor temporalis and masseter strength intact. CN VII : muscles of facial expression intact. CN X :  midline uvula. XI strength of sternocleidomastoid and trapezius muscles 5/5, XII: tongue is midline when protruded. - Motor: No involuntary movements. Muscle tone and bulk normal throughout. Muscle strength is 5/5 in bilateral shoulder abduction, elbow flexion and extension, grip, hip extension, flexion, leg flexion and extension, ankle dorsiflexion and plantar flexion.  - Sensory: Proprioception, light tough sensation intact in all extremities.  - Reflexes: patellar 2 + and equal bilaterally; toes down going bilaterally.  - Cerebellar: rapid alternating movements and point to point movement intact in upper and lower extremities. Normal stance and gait.  Skin: Skin is warm and dry. No rash noted. She is not diaphoretic. No erythema.  Psychiatric: She has a normal mood and affect.  Nursing note and vitals reviewed.    ED Treatments / Results  Labs (all labs ordered are listed, but only abnormal results are displayed) Labs Reviewed  WET PREP, GENITAL -  Abnormal; Notable for the following:       Result Value   Yeast Wet Prep HPF POC PRESENT (*)    Clue Cells Wet Prep HPF POC PRESENT (*)    WBC, Wet Prep HPF POC MODERATE (*)    All other components within normal limits  URINALYSIS, ROUTINE W REFLEX MICROSCOPIC - Abnormal; Notable for the following:    APPearance CLOUDY (*)    All other components within normal limits  PREGNANCY, URINE  RPR  HIV ANTIBODY (ROUTINE TESTING)  GC/CHLAMYDIA PROBE AMP ([place]) NOT AT [facility]    EKG  EKG Interpretation None       Radiology No results found.  Procedures Procedures (including critical care time)  Medications Ordered in ED Medications  metoCLOPramide (REGLAN) injection 10 mg (10 mg Intravenous Refused 05/22/17 1321)  diphenhydrAMINE (BENADRYL) injection 25 mg (25 mg Intravenous Refused 05/22/17 1322)  sodium chloride 0.9 % bolus 500 mL (0 mLs Intravenous Stopped 05/22/17 1506)  fluconazole (DIFLUCAN) tablet 150 mg (150 mg Oral Given 05/22/17 1429)     Initial Impression / Assessment and Plan / ED Course  I have reviewed the triage vital signs and the nursing notes.  Pertinent labs & imaging results that were available during my care of the patient were reviewed by me and considered in my medical decision making (see chart for details).    Patient presents with  a week of vaginal discharge and a headache since today. Normal neuro exam. Patient given migraine cocktail and on reassessment reported significant improvement.  Thick white cottage cheese type discharge on pelvic exam. No cervical motion tenderness or other concerning cervical discharge.   Wet prep with evidence of bacterial vaginosis and yeast.  Patient was given a one-time dose of fluconazole while in ED. She is otherwise well-appearing, afebrile, nontoxic exam and workup otherwise unremarkable. She improved while in the emergency department.  Discharge home with metronidazole, advised to avoid sexual intercourse  for at least 7 days. Explained to patient that she will get a call if any of her cultures return positive. No suggestion of current STI infection based on exam. Will hold antibiotics as needed if positive cultures.  Follow-up with PCP and/or for GYN. Discussed strict return precautions and advised to return to the emergency department if experiencing any new or worsening symptoms. Instructions were understood and patient agreed with discharge plan.  Final Clinical Impressions(s) / ED Diagnoses   Final diagnoses:  Vaginal yeast infection  BV (bacterial vaginosis)    New Prescriptions Discharge Medication List as of 05/22/2017  2:57 PM    START taking these medications   Details  metroNIDAZOLE (FLAGYL) 500 MG tablet Take 1 tablet (500 mg total) by mouth 2 (two) times daily., Starting Mon 05/22/2017, Until Mon 05/29/2017, Print         Mathews Robinsons B, PA-C 05/22/17 1759    Rolan Bucco, MD 05/23/17 [phone]

## 2017-05-23 LAB — GC/CHLAMYDIA PROBE AMP (~~LOC~~) NOT AT ARMC
Chlamydia: NEGATIVE
Neisseria Gonorrhea: NEGATIVE

## 2017-05-23 LAB — RPR: RPR: NONREACTIVE

## 2017-05-23 LAB — HIV ANTIBODY (ROUTINE TESTING W REFLEX): HIV SCREEN 4TH GENERATION: NONREACTIVE

## 2017-05-26 NOTE — ED Notes (Signed)
Pt. Called for results of her cultures.  Results given.  Pt. Verbalized understanding

## 2017-06-10 ENCOUNTER — Emergency Department (HOSPITAL_BASED_OUTPATIENT_CLINIC_OR_DEPARTMENT_OTHER)
Admission: EM | Admit: 2017-06-10 | Discharge: 2017-06-10 | Disposition: A | Discharge status: Home / Self Care | Payer: Medicare Other | Attending: Emergency Medicine | Admitting: Emergency Medicine

## 2017-06-10 ENCOUNTER — Encounter (HOSPITAL_BASED_OUTPATIENT_CLINIC_OR_DEPARTMENT_OTHER): Payer: Self-pay

## 2017-06-10 DIAGNOSIS — H6122 Impacted cerumen, left ear: Secondary | ICD-10-CM | POA: Diagnosis not present

## 2017-06-10 DIAGNOSIS — Z79899 Other long term (current) drug therapy: Secondary | ICD-10-CM | POA: Insufficient documentation

## 2017-06-10 DIAGNOSIS — H9312 Tinnitus, left ear: Secondary | ICD-10-CM | POA: Diagnosis present

## 2017-06-10 NOTE — ED Triage Notes (Signed)
Pt c/o muffled hearing in L ear, and tinnitus. Pt denies pain.

## 2017-06-10 NOTE — ED Provider Notes (Signed)
[facility]   CSN: [id number] Arrival date & time: 06/10/17  1545     History   Chief Complaint Chief Complaint  Patient presents with  . Tinnitus    HPI Taylor Stevens is a 26 y.o. female.  HPI  26 y.o. female, presents to the Emergency Department today due to left ear ringing. Denies pain. States she woke up with decreased hearing to left ear. Right ear is unchanged. No cough/congestion. No sinus pressure. No sick contacts. No fevers. No trouble swallowing. No discharge from ear. No bleeding. No other symptoms noted.    History reviewed. No pertinent past medical history.  There are no active problems to display for this patient.   History reviewed. No pertinent surgical history.  OB History    No data available       Home Medications    Prior to Admission medications   Medication Sig Start Date End Date Taking? Authorizing Provider  diphenhydrAMINE (BENADRYL) 25 MG tablet Take 1 tablet (25 mg total) by mouth every 6 (six) hours. 03/27/17 04/01/17  Shaune Pollack, MD  docusate sodium (COLACE) 100 MG capsule Take 1 capsule (100 mg total) by mouth every 12 (twelve) hours. 05/15/17   Arby Barrette, MD  omeprazole (PRILOSEC) 20 MG capsule Take 1 capsule (20 mg total) by mouth daily. 05/15/17   Arby Barrette, MD    Family History No family history on file.  Social History Social History  Substance Use Topics  . Smoking status: Never Smoker  . Smokeless tobacco: Never Used  . Alcohol use Yes     Comment: occasionally     Allergies   Shellfish allergy   Review of Systems Review of Systems ROS reviewed and all are negative for acute change except as noted in the HPI.  Physical Exam Updated Vital Signs BP 121/82 (BP Location: Left Arm)   Pulse 80   Temp 98.3 F (36.8 C) (Oral)   Resp 20   Ht  (1.575 m)   Wt 73 kg (161 lb)   LMP 04/24/2017   SpO2 100%   BMI 29.45 kg/m   Physical Exam  Constitutional: She is  oriented to person, place, and time. Vital signs are normal. She appears well-developed and well-nourished.  HENT:  Head: Normocephalic and atraumatic.  Right Ear: Hearing, tympanic membrane, external ear and ear canal normal.  Left Ear: Hearing, tympanic membrane and external ear normal.  Cerumen impaction noted left ear canal. No erythema. Non TTP  Eyes: Pupils are equal, round, and reactive to light. Conjunctivae and EOM are normal.  Cardiovascular: Normal rate and regular rhythm.   Pulmonary/Chest: Effort normal.  Neurological: She is alert and oriented to person, place, and time.  Skin: Skin is warm and dry.  Psychiatric: She has a normal mood and affect. Her speech is normal and behavior is normal. Thought content normal.  Nursing note and vitals reviewed.    ED Treatments / Results  Labs (all labs ordered are listed, but only abnormal results are displayed) Labs Reviewed - No data to display  EKG  EKG Interpretation None       Radiology No results found.  Procedures .Ear Cerumen Removal Date/Time: 06/10/2017 5:02 PM Performed by: Audry Pili Authorized by: Audry Pili   Consent:    Consent obtained:  Verbal   Consent given by:  Patient and parent   Risks discussed:  Bleeding, incomplete removal, dizziness and infection   Alternatives discussed:  No treatment Procedure details:  Location:  L ear   Procedure type: curette   Post-procedure details:    Inspection:  Bleeding   Hearing quality:  Normal   Patient tolerance of procedure:  Tolerated well, no immediate complications   (including critical care time)  Medications Ordered in ED Medications - No data to display   Initial Impression / Assessment and Plan / ED Course  I have reviewed the triage vital signs and the nursing notes.  Pertinent labs & imaging results that were available during my care of the patient were reviewed by me and considered in my medical decision making (see chart for  details).  Final Clinical Impressions(s) / ED Diagnoses     {I have reviewed the relevant previous healthcare records.  {I obtained HPI from historian.   ED Course:  Assessment: Pt is a 26 y.o. female presents to the Emergency Department today due to left ear ringing. Denies pain. States she woke up with decreased hearing to left ear. Right ear is unchanged. No cough/congestion. No sinus pressure. No sick contacts. No fevers. No trouble swallowing. No discharge from ear. No bleeding. On exam, pt in NAD. Nontoxic/nonseptic appearing. VSS. Afebrile. Cerumen impaction noted left auditory canal. Cleaned with curette. TM unermarkable. No signs of infection. Plan is to DC home with follow up to PCP. At time of discharge, Patient is in no acute distress. Vital Signs are stable. Patient is able to ambulate. Patient able to tolerate PO.   Disposition/Plan:  DC Home Additional Verbal discharge instructions given and discussed with patient.  Pt Instructed to f/u with PCP in the next week for evaluation and treatment of symptoms. Return precautions given Pt acknowledges and agrees with plan  Supervising Physician Arby BarrettePfeiffer, Marcy, MD  Final diagnoses:  Impacted cerumen of left ear    New Prescriptions New Prescriptions   No medications on file     Audry PiliMohr, Anterrio Mccleery, Cordelia Poche-C 06/10/17 1702    Arby BarrettePfeiffer, Marcy, MD 06/20/17 [phone]

## 2017-09-02 ENCOUNTER — Other Ambulatory Visit: Payer: Self-pay

## 2017-09-02 ENCOUNTER — Encounter (HOSPITAL_BASED_OUTPATIENT_CLINIC_OR_DEPARTMENT_OTHER): Payer: Self-pay | Admitting: Emergency Medicine

## 2017-09-02 ENCOUNTER — Emergency Department (HOSPITAL_BASED_OUTPATIENT_CLINIC_OR_DEPARTMENT_OTHER)
Admission: EM | Admit: 2017-09-02 | Discharge: 2017-09-02 | Disposition: A | Discharge status: Home / Self Care | Payer: Medicare Other | Attending: Emergency Medicine | Admitting: Emergency Medicine

## 2017-09-02 DIAGNOSIS — R109 Unspecified abdominal pain: Secondary | ICD-10-CM | POA: Diagnosis not present

## 2017-09-02 DIAGNOSIS — Z5321 Procedure and treatment not carried out due to patient leaving prior to being seen by health care provider: Secondary | ICD-10-CM | POA: Diagnosis not present

## 2017-09-02 DIAGNOSIS — R112 Nausea with vomiting, unspecified: Secondary | ICD-10-CM | POA: Insufficient documentation

## 2017-09-02 LAB — URINALYSIS, ROUTINE W REFLEX MICROSCOPIC
Bilirubin Urine: NEGATIVE
GLUCOSE, UA: NEGATIVE mg/dL
Ketones, ur: 15 mg/dL — AB
LEUKOCYTES UA: NEGATIVE
Nitrite: NEGATIVE
PROTEIN: NEGATIVE mg/dL
Specific Gravity, Urine: >1.03 — ABNORMAL HIGH (ref 1.005–1.030)
pH: 6 (ref 5.0–8.0)

## 2017-09-02 LAB — URINALYSIS, MICROSCOPIC (REFLEX): RBC / HPF: NONE SEEN RBC/hpf (ref 0–5)

## 2017-09-02 LAB — PREGNANCY, URINE: PREG TEST UR: NEGATIVE

## 2017-09-02 NOTE — ED Notes (Signed)
Pt leaving d/t no longer wants to wait

## 2017-09-02 NOTE — ED Triage Notes (Signed)
Patient reports abdominal pain which began 2 weeks ago.  Reports nausea, vomiting.  Denies diarrhea.

## 2017-09-03 ENCOUNTER — Emergency Department (HOSPITAL_BASED_OUTPATIENT_CLINIC_OR_DEPARTMENT_OTHER)
Admission: EM | Admit: 2017-09-03 | Discharge: 2017-09-03 | Disposition: A | Discharge status: Home / Self Care | Payer: Medicare Other | Attending: Emergency Medicine | Admitting: Emergency Medicine

## 2017-09-03 ENCOUNTER — Other Ambulatory Visit: Payer: Self-pay

## 2017-09-03 ENCOUNTER — Encounter (HOSPITAL_BASED_OUTPATIENT_CLINIC_OR_DEPARTMENT_OTHER): Payer: Self-pay | Admitting: *Deleted

## 2017-09-03 DIAGNOSIS — R109 Unspecified abdominal pain: Secondary | ICD-10-CM | POA: Diagnosis present

## 2017-09-03 DIAGNOSIS — R1110 Vomiting, unspecified: Secondary | ICD-10-CM | POA: Diagnosis not present

## 2017-09-03 DIAGNOSIS — R1013 Epigastric pain: Secondary | ICD-10-CM

## 2017-09-03 DIAGNOSIS — Z79899 Other long term (current) drug therapy: Secondary | ICD-10-CM | POA: Insufficient documentation

## 2017-09-03 LAB — URINALYSIS, ROUTINE W REFLEX MICROSCOPIC
BILIRUBIN URINE: NEGATIVE
Glucose, UA: NEGATIVE mg/dL
KETONES UR: 15 mg/dL — AB
LEUKOCYTES UA: NEGATIVE
NITRITE: NEGATIVE
Protein, ur: NEGATIVE mg/dL
Specific Gravity, Urine: >1.03 — ABNORMAL HIGH (ref 1.005–1.030)
pH: 6 (ref 5.0–8.0)

## 2017-09-03 LAB — CBC
HCT: 38 % (ref 36.0–46.0)
Hemoglobin: 13 g/dL (ref 12.0–15.0)
MCH: 33.1 pg (ref 26.0–34.0)
MCHC: 34.2 g/dL (ref 30.0–36.0)
MCV: 96.7 fL (ref 78.0–100.0)
PLATELETS: 224 10*3/uL (ref 150–400)
RBC: 3.93 MIL/uL (ref 3.87–5.11)
RDW: 11.2 % — AB (ref 11.5–15.5)
WBC: 6.3 10*3/uL (ref 4.0–10.5)

## 2017-09-03 LAB — COMPREHENSIVE METABOLIC PANEL
ALK PHOS: 31 U/L — AB (ref 38–126)
ALT: 12 U/L — AB (ref 14–54)
AST: 17 U/L (ref 15–41)
Albumin: 3.9 g/dL (ref 3.5–5.0)
Anion gap: 4 — ABNORMAL LOW (ref 5–15)
BILIRUBIN TOTAL: 0.8 mg/dL (ref 0.3–1.2)
BUN: 10 mg/dL (ref 6–20)
CALCIUM: 8.6 mg/dL — AB (ref 8.9–10.3)
CO2: 25 mmol/L (ref 22–32)
CREATININE: 0.58 mg/dL (ref 0.44–1.00)
Chloride: 106 mmol/L (ref 101–111)
Glucose, Bld: 89 mg/dL (ref 65–99)
Potassium: 3.3 mmol/L — ABNORMAL LOW (ref 3.5–5.1)
Sodium: 135 mmol/L (ref 135–145)
TOTAL PROTEIN: 6.9 g/dL (ref 6.5–8.1)

## 2017-09-03 LAB — PREGNANCY, URINE: Preg Test, Ur: NEGATIVE

## 2017-09-03 LAB — URINALYSIS, MICROSCOPIC (REFLEX)
RBC / HPF: NONE SEEN RBC/hpf (ref 0–5)
WBC UA: NONE SEEN WBC/hpf (ref 0–5)

## 2017-09-03 LAB — LIPASE, BLOOD: LIPASE: 28 U/L (ref 11–51)

## 2017-09-03 MED ORDER — OMEPRAZOLE 20 MG PO CPDR: 20.0000 mg | DELAYED_RELEASE_CAPSULE | Freq: Every day | ORAL | 0 refills | Status: DC

## 2017-09-03 NOTE — ED Provider Notes (Signed)
[facility] EMERGENCY DEPARTMENT Provider Note   CSN: [id number] Arrival date & time: 09/03/17  [id number]     History   Chief Complaint Chief Complaint  Patient presents with  . Abdominal Pain    HPI Hassan Bucklerrvanea Spenser is a 26 y.o. female.  Elba Barmanrvanea is a 26 year old female presenting with 1 month of abdominal pain described as abdominal fullness with occasional sharp pains in the central abdomen and on her left and right sides.  Has tried Pepto-Bismol and laxatives without any success.  Denies persistent nausea, vomiting or diarrhea however did have some vomiting yesterday that was nonbloody nonbilious.  Bowel movement was yesterday and feels like she has to go again right now.  Patient has IUD in place that is reportedly expired recently and this plan to get that out in the next week.  She is not currently sexually active with her boyfriend due to her discomfort.  Does drink alcohol about every other day but typically only 1-2 drinks.  she denies any paroxysmal pains, any fevers, chills or weight loss.      History reviewed. No pertinent past medical history.  There are no active problems to display for this patient.   History reviewed. No pertinent surgical history.  OB History    No data available       Home Medications    Prior to Admission medications   Medication Sig Start Date End Date Taking? Authorizing Provider  diphenhydrAMINE (BENADRYL) 25 MG tablet Take 1 tablet (25 mg total) by mouth every 6 (six) hours. 03/27/17 04/01/17  Shaune PollackIsaacs, Cameron, MD  docusate sodium (COLACE) 100 MG capsule Take 1 capsule (100 mg total) by mouth every 12 (twelve) hours. 05/15/17   Arby BarrettePfeiffer, Marcy, MD  omeprazole (PRILOSEC) 20 MG capsule Take 1 capsule (20 mg total) by mouth daily. 09/03/17   Renne MuscaWarden, Xan Ingraham L, MD    Family History History reviewed. No pertinent family history.  Social History Social History   Tobacco Use  . Smoking status: Never Smoker  . Smokeless tobacco:  Never Used  Substance Use Topics  . Alcohol use: Yes    Comment: occasionally  . Drug use: No     Allergies   Shellfish allergy   Review of Systems Review of Systems  Constitutional: Positive for appetite change. Negative for activity change, chills, diaphoresis, fatigue, fever and unexpected weight change.  HENT: Negative.   Eyes: Negative.   Respiratory: Negative.   Cardiovascular: Negative.   Gastrointestinal: Positive for abdominal pain and vomiting. Negative for blood in stool, constipation, diarrhea, nausea and rectal pain.  Endocrine: Negative.   Genitourinary: Negative.   Musculoskeletal: Negative.   Skin: Negative.   Neurological: Negative.      Physical Exam Updated Vital Signs BP 99/64 (BP Location: Right Arm)   Pulse 84   Temp 98.9 F (37.2 C) (Oral)   Resp 16   Ht 5\' 2"  (1.575 m)   Wt 68 kg (150 lb)   LMP 08/30/2017   SpO2 100%   BMI 27.44 kg/m   Physical Exam  Constitutional: She is oriented to person, place, and time. She appears well-developed and well-nourished. She does not appear ill. No distress.  HENT:  Head: Normocephalic and atraumatic.  Mouth/Throat: Oropharynx is clear and moist.  Eyes: EOM are normal. Pupils are equal, round, and reactive to light. No scleral icterus.  Cardiovascular: Normal rate, regular rhythm and normal heart sounds.  No murmur heard. Pulmonary/Chest: Effort normal and breath sounds normal. No respiratory distress.  Abdominal: Soft. Normal appearance. She exhibits no distension. Bowel sounds are increased. There is no hepatosplenomegaly. There is no tenderness. There is no CVA tenderness and negative Murphy's sign.  Neurological: She is alert and oriented to person, place, and time.  Skin: Skin is warm and dry. Capillary refill takes less than 2 seconds.     ED Treatments / Results  Labs (all labs ordered are listed, but only abnormal results are displayed) Labs Reviewed  URINALYSIS, ROUTINE W REFLEX  MICROSCOPIC - Abnormal; Notable for the following components:      Result Value   APPearance CLOUDY (*)    Specific Gravity, Urine >1.030 (*)    Hgb urine dipstick SMALL (*)    Ketones, ur 15 (*)    All other components within normal limits  URINALYSIS, MICROSCOPIC (REFLEX) - Abnormal; Notable for the following components:   Bacteria, UA MANY (*)    Squamous Epithelial / LPF 6-30 (*)    All other components within normal limits  COMPREHENSIVE METABOLIC PANEL - Abnormal; Notable for the following components:   Potassium 3.3 (*)    Calcium 8.6 (*)    ALT 12 (*)    Alkaline Phosphatase 31 (*)    Anion gap 4 (*)    All other components within normal limits  CBC - Abnormal; Notable for the following components:   RDW 11.2 (*)    All other components within normal limits  PREGNANCY, URINE  LIPASE, BLOOD    EKG  EKG Interpretation None       Radiology No results found.  Procedures Procedures (including critical care time)  Medications Ordered in ED Medications - No data to display   Initial Impression / Assessment and Plan / ED Course  I have reviewed the triage vital signs and the nursing notes.  Pertinent labs & imaging results that were available during my care of the patient were reviewed by me and considered in my medical decision making (see chart for details).  26 year old female one month of abdominal pain with no red flag signs or symptoms and negative pregnancy test.  History significant for early satiety, feelings of bloating may suggest peptic ulcer disease or GERD and less likely to be more significant pathology, negative Murphy sign and no abdominal tenderness. Upon further questioning patient reports drinking 3-4 shots of alcohol every other day. Given moderate alcohol use lipase was checked which was normal. Additionally, LFTs were checked to rule out current gallbladder pathology and these were also normal.  Unclear etiology at this time. Recommending trial of  PPI x1 month and following up with PCP. Return precautions discussed with patient.   Final Clinical Impressions(s) / ED Diagnoses   Final diagnoses:  Epigastric pain    ED Discharge Orders        Ordered    omeprazole (PRILOSEC) 20 MG capsule  Daily     09/03/17 1400     Mihaela Fajardo L. Myrtie SomanWarden, MD [facility] Resident PGY-2 09/03/2017 2:00 PM      Renne MuscaWarden, Aryelle Figg L, MD 09/03/17 1400    Little, Ambrose Finlandachel Morgan, MD 09/07/17 1026

## 2017-09-03 NOTE — ED Triage Notes (Signed)
Pt c/o epigastric abd pain x 67month

## 2017-09-03 NOTE — Discharge Instructions (Signed)
Taylor Stevens, I am unsure what is causing your abdominal pain at this time. You do not have any signs or symptoms that make me think of serious things like cancer. At this time I would recommend trying a medication for acid reflux called omeprazole. I have given you a prescription.  Please take this medication daily for one month. Please follow up with your PCP.

## 2017-09-03 NOTE — ED Notes (Addendum)
Assumed care of patient from [place] Mountain[place]ophie, [place]. Pt resting quietly at this time. No distress. Reports 2 week history of epigastric pain, that has been intermittent. PT requesting Imaging.

## 2017-09-30 ENCOUNTER — Other Ambulatory Visit: Payer: Self-pay | Admitting: Family Medicine

## 2017-10-11 ENCOUNTER — Encounter (HOSPITAL_BASED_OUTPATIENT_CLINIC_OR_DEPARTMENT_OTHER): Payer: Self-pay | Admitting: *Deleted

## 2017-10-11 ENCOUNTER — Other Ambulatory Visit: Payer: Self-pay

## 2017-10-11 ENCOUNTER — Emergency Department (HOSPITAL_BASED_OUTPATIENT_CLINIC_OR_DEPARTMENT_OTHER)
Admission: EM | Admit: 2017-10-11 | Discharge: 2017-10-11 | Discharge status: Against Medical Advice | Payer: Medicare Other | Attending: Emergency Medicine | Admitting: Emergency Medicine

## 2017-10-11 DIAGNOSIS — R079 Chest pain, unspecified: Secondary | ICD-10-CM | POA: Diagnosis not present

## 2017-10-11 DIAGNOSIS — Z5321 Procedure and treatment not carried out due to patient leaving prior to being seen by health care provider: Secondary | ICD-10-CM | POA: Diagnosis not present

## 2017-10-11 HISTORY — DX: Gastro-esophageal reflux disease without esophagitis: K21.9

## 2017-10-11 NOTE — ED Notes (Signed)
Pt states she is leaving-advised to return prn-NAD

## 2017-10-11 NOTE — ED Triage Notes (Addendum)
Pt c/o epigastric / CP x 2 days described as burning.  pt states she has not been taking her prilosec

## 2017-12-30 ENCOUNTER — Other Ambulatory Visit: Payer: Self-pay

## 2017-12-30 ENCOUNTER — Emergency Department (HOSPITAL_BASED_OUTPATIENT_CLINIC_OR_DEPARTMENT_OTHER)
Admission: EM | Admit: 2017-12-30 | Discharge: 2017-12-30 | Disposition: A | Discharge status: Home / Self Care | Payer: Medicare Other | Attending: Emergency Medicine | Admitting: Emergency Medicine

## 2017-12-30 ENCOUNTER — Encounter (HOSPITAL_BASED_OUTPATIENT_CLINIC_OR_DEPARTMENT_OTHER): Payer: Self-pay | Admitting: Emergency Medicine

## 2017-12-30 DIAGNOSIS — Z79899 Other long term (current) drug therapy: Secondary | ICD-10-CM | POA: Diagnosis not present

## 2017-12-30 DIAGNOSIS — R197 Diarrhea, unspecified: Secondary | ICD-10-CM | POA: Diagnosis not present

## 2017-12-30 MED ORDER — ONDANSETRON HCL 4 MG PO TABS: 4.0000 mg | ORAL_TABLET | Freq: Three times a day (TID) | ORAL | 0 refills | Status: DC | PRN

## 2017-12-30 NOTE — ED Provider Notes (Signed)
[facility] EMERGENCY DEPARTMENT Provider Note   CSN: [id number] Arrival date & time: 12/30/17  1033     History   Chief Complaint Chief Complaint  Patient presents with  . Diarrhea    HPI Taylor Stevens is a 27 y.o. female presenting for evaluation of diarrhea.  Patient states for the past 3 days, she has had diarrhea.  She is having 2-5 episodes a day.  They are nonbloody and nonpainful.  She reports generalized abdominal irritation, but no pain or discomfort.  She has some nausea without vomiting.  Decreased appetite due to feeling poorly.  She denies fevers, chills, chest pain, shortness of breath, cough, or urinary symptoms.  She ate seafood the day before.  No one else around her is sick.  No recent travel.  No history of abdominal problems other than GERD.  No abdominal surgeries.  HPI  Past Medical History:  Diagnosis Date  . GERD (gastroesophageal reflux disease)     There are no active problems to display for this patient.   History reviewed. No pertinent surgical history.   OB History   None      Home Medications    Prior to Admission medications   Medication Sig Start Date End Date Taking? Authorizing Provider  diphenhydrAMINE (BENADRYL) 25 MG tablet Take 1 tablet (25 mg total) by mouth every 6 (six) hours. 03/27/17 04/01/17  Shaune Pollack, MD  docusate sodium (COLACE) 100 MG capsule Take 1 capsule (100 mg total) by mouth every 12 (twelve) hours. 05/15/17   Arby Barrette, MD  omeprazole (PRILOSEC) 20 MG capsule Take 1 capsule (20 mg total) by mouth daily. 09/03/17   Renne Musca, MD  ondansetron (ZOFRAN) 4 MG tablet Take 1 tablet (4 mg total) by mouth every 8 (eight) hours as needed for nausea or vomiting. 12/30/17   Vernadette Stutsman, PA-C    Family History No family history on file.  Social History Social History   Tobacco Use  . Smoking status: Never Smoker  . Smokeless tobacco: Never Used  Substance Use Topics  . Alcohol use: Yes     Comment: occasionally  . Drug use: No     Allergies   Shellfish allergy   Review of Systems Review of Systems  Gastrointestinal: Positive for diarrhea and nausea.  All other systems reviewed and are negative.    Physical Exam Updated Vital Signs BP 106/78 (BP Location: Right Arm)   Pulse 84   Temp 98.6 F (37 C) (Oral)   Resp 16   Ht 5\' 3"  (1.6 m)   Wt 68 kg (150 lb)   LMP 12/20/2017   SpO2 100%   BMI 26.57 kg/m   Physical Exam  Constitutional: She is oriented to person, place, and time. She appears well-developed and well-nourished. No distress.  Patient resting comfortably in bed in no apparent distress.  Does not appear dehydrated.  HENT:  Head: Normocephalic and atraumatic.  Mouth/Throat: Mucous membranes are normal.  Eyes: Pupils are equal, round, and reactive to light. Conjunctivae and EOM are normal.  Neck: Normal range of motion. Neck supple.  Cardiovascular: Normal rate, regular rhythm and intact distal pulses.  Pulmonary/Chest: Effort normal and breath sounds normal. No respiratory distress. She has no wheezes.  Abdominal: Soft. Bowel sounds are normal. She exhibits no distension and no mass. There is no tenderness. There is no guarding.  No tenderness to palpation of the abdomen.  Soft without rigidity, guarding, or distention.  Bowel sounds normoactive x4.  Musculoskeletal:  Normal range of motion.  Neurological: She is alert and oriented to person, place, and time.  Skin: Skin is warm and dry.  Psychiatric: She has a normal mood and affect.  Nursing note and vitals reviewed.    ED Treatments / Results  Labs (all labs ordered are listed, but only abnormal results are displayed) Labs Reviewed - No data to display  EKG None  Radiology No results found.  Procedures Procedures (including critical care time)  Medications Ordered in ED Medications - No data to display   Initial Impression / Assessment and Plan / ED Course  I have reviewed  the triage vital signs and the nursing notes.  Pertinent labs & imaging results that were available during my care of the patient were reviewed by me and considered in my medical decision making (see chart for details).     Patient presenting for evaluation of diarrhea.  Physical exam reassuring, she is afebrile not tachycardic.  She appears nontoxic.  She does not appear dehydrated, and abdominal exam is reassuring.  Likely gastroenteritis (? If 2/2 virus vs food, both usually self-limiting).  At this time, doubt intra-abdominal infection requiring surgery, perforation, or obstruction.  I do not believe labs or imaging would be beneficial at this time.  Offered Zofran and p.o. challenge, but patient states she would rather a prescription and be discharged.  At this time, patient appears safe for discharge.  Return precautions given.  Patient states she understands and agrees to plan.  Final Clinical Impressions(s) / ED Diagnoses   Final diagnoses:  Diarrhea, unspecified type    ED Discharge Orders        Ordered    ondansetron (ZOFRAN) 4 MG tablet  Every 8 hours PRN     12/30/17 1249       Nakira Litzau, PA-C 12/30/17 1356    Little, Ambrose Finlandachel Morgan, MD 01/01/18 1709

## 2017-12-30 NOTE — ED Triage Notes (Signed)
Diarrhea x 3 days, denies abd pain at present

## 2017-12-30 NOTE — Discharge Instructions (Signed)
Use Zofran as needed for nausea or vomiting. Make sure you are staying well-hydrated with liquids.  This is very important. Be careful with your food choices over the next several days, avoid greasy, fatty, dairy, or sugary foods. Return to the ER if you develop persistent vomiting despite medication, severe abdominal pain, or any new or concerning symptoms.

## 2018-07-12 ENCOUNTER — Encounter (HOSPITAL_BASED_OUTPATIENT_CLINIC_OR_DEPARTMENT_OTHER): Payer: Self-pay | Admitting: *Deleted

## 2018-07-12 ENCOUNTER — Other Ambulatory Visit: Payer: Self-pay

## 2018-07-12 ENCOUNTER — Emergency Department (HOSPITAL_BASED_OUTPATIENT_CLINIC_OR_DEPARTMENT_OTHER)
Admission: EM | Admit: 2018-07-12 | Discharge: 2018-07-12 | Discharge status: Against Medical Advice | Payer: Medicare Other | Attending: Emergency Medicine | Admitting: Emergency Medicine

## 2018-07-12 DIAGNOSIS — Z5321 Procedure and treatment not carried out due to patient leaving prior to being seen by health care provider: Secondary | ICD-10-CM | POA: Diagnosis not present

## 2018-07-12 DIAGNOSIS — H9201 Otalgia, right ear: Secondary | ICD-10-CM | POA: Diagnosis not present

## 2018-07-12 NOTE — ED Triage Notes (Signed)
Pt c/o right ear pain x 1 day

## 2018-09-23 ENCOUNTER — Encounter (HOSPITAL_BASED_OUTPATIENT_CLINIC_OR_DEPARTMENT_OTHER): Payer: Self-pay | Admitting: Emergency Medicine

## 2018-09-23 ENCOUNTER — Other Ambulatory Visit: Payer: Self-pay

## 2018-09-23 ENCOUNTER — Emergency Department (HOSPITAL_BASED_OUTPATIENT_CLINIC_OR_DEPARTMENT_OTHER): Payer: Medicare Other

## 2018-09-23 ENCOUNTER — Emergency Department (HOSPITAL_BASED_OUTPATIENT_CLINIC_OR_DEPARTMENT_OTHER)
Admission: EM | Admit: 2018-09-23 | Discharge: 2018-09-23 | Disposition: A | Discharge status: Home / Self Care | Payer: Medicare Other | Attending: Emergency Medicine | Admitting: Emergency Medicine

## 2018-09-23 DIAGNOSIS — K2970 Gastritis, unspecified, without bleeding: Secondary | ICD-10-CM | POA: Insufficient documentation

## 2018-09-23 DIAGNOSIS — Z79899 Other long term (current) drug therapy: Secondary | ICD-10-CM | POA: Diagnosis not present

## 2018-09-23 DIAGNOSIS — R002 Palpitations: Secondary | ICD-10-CM | POA: Insufficient documentation

## 2018-09-23 DIAGNOSIS — R079 Chest pain, unspecified: Secondary | ICD-10-CM | POA: Diagnosis present

## 2018-09-23 LAB — BASIC METABOLIC PANEL
ANION GAP: 7 (ref 5–15)
BUN: 16 mg/dL (ref 6–20)
CO2: 23 mmol/L (ref 22–32)
Calcium: 8.8 mg/dL — ABNORMAL LOW (ref 8.9–10.3)
Chloride: 104 mmol/L (ref 98–111)
Creatinine, Ser: 0.71 mg/dL (ref 0.44–1.00)
GFR calc Af Amer: >60 mL/min (ref >60)
GLUCOSE: 94 mg/dL (ref 70–99)
Potassium: 3.8 mmol/L (ref 3.5–5.1)
Sodium: 134 mmol/L — ABNORMAL LOW (ref 135–145)

## 2018-09-23 LAB — HEPATIC FUNCTION PANEL
ALT: 12 U/L (ref 0–44)
AST: 20 U/L (ref 15–41)
Albumin: 4.1 g/dL (ref 3.5–5.0)
Alkaline Phosphatase: 34 U/L — ABNORMAL LOW (ref 38–126)
BILIRUBIN DIRECT: 0.2 mg/dL (ref 0.0–0.2)
BILIRUBIN INDIRECT: 1.2 mg/dL — AB (ref 0.3–0.9)
BILIRUBIN TOTAL: 1.4 mg/dL — AB (ref 0.3–1.2)
Total Protein: 7.7 g/dL (ref 6.5–8.1)

## 2018-09-23 LAB — LIPASE, BLOOD: Lipase: 33 U/L (ref 11–51)

## 2018-09-23 LAB — CBC
HCT: 41.1 % (ref 36.0–46.0)
Hemoglobin: 13.3 g/dL (ref 12.0–15.0)
MCH: 31.9 pg (ref 26.0–34.0)
MCHC: 32.4 g/dL (ref 30.0–36.0)
MCV: 98.6 fL (ref 80.0–100.0)
PLATELETS: 221 10*3/uL (ref 150–400)
RBC: 4.17 MIL/uL (ref 3.87–5.11)
RDW: 11.5 % (ref 11.5–15.5)
WBC: 6.8 10*3/uL (ref 4.0–10.5)
nRBC: 0 % (ref 0.0–0.2)

## 2018-09-23 LAB — TROPONIN I

## 2018-09-23 LAB — PREGNANCY, URINE: PREG TEST UR: NEGATIVE

## 2018-09-23 MED ORDER — OMEPRAZOLE 20 MG PO CPDR: 20.0000 mg | DELAYED_RELEASE_CAPSULE | Freq: Every day | ORAL | 0 refills | Status: DC

## 2018-09-23 MED ORDER — ALUM & MAG HYDROXIDE-SIMETH 200-200-20 MG/5ML PO SUSP
30.0000 mL | Freq: Once | ORAL | Status: AC
  Administered 2018-09-23: 30 mL via ORAL
  Filled 2018-09-23: qty 30

## 2018-09-23 MED ORDER — LIDOCAINE VISCOUS HCL 2 % MT SOLN
15.0000 mL | Freq: Once | OROMUCOSAL | Status: AC
  Administered 2018-09-23: 15 mL via ORAL
  Filled 2018-09-23: qty 15

## 2018-09-23 NOTE — ED Triage Notes (Signed)
Patient here with fluttering in heart since this am. Pt father died unexpectedly and states she has not been taking it well.

## 2018-09-23 NOTE — ED Provider Notes (Signed)
[facility] EMERGENCY DEPARTMENT Provider Note   CSN: [id number] Arrival date & time: 09/23/18  1226     History   Chief Complaint Chief Complaint  Patient presents with  . Chest Pain  . Palpitations    HPI Taylor Stevens is a 27 y.o. female with a past medical history of GERD who presents to ED for evaluation of multiple complaints. Her first complaint is chest "not feeling right" since she woke up this morning.  Her father passed away unexpectedly 2 days ago and she is unsure if this is causing her to have this feeling.  She denies history of similar symptoms in the past.  States that the pain feels like a "cramp" and has been intermittent with no specific aggravating or alleviating factors.  She has not tried any medications to help with her symptoms.  Denies any pleuritic chest pain, shortness of breath, recent immobilization, leg swelling, OCP use, history of DVT, PE or MI. Her next complaint is 3-week history of abdominal cramping and "gurgling."  She notes that she takes NyQuil about 3 times a week to help her sleep.  Unsure if this is related to her symptoms.  She denies any nausea, vomiting, changes to bowel movements, fever, vaginal complaints.  HPI  Past Medical History:  Diagnosis Date  . GERD (gastroesophageal reflux disease)     There are no active problems to display for this patient.   History reviewed. No pertinent surgical history.   OB History   No obstetric history on file.      Home Medications    Prior to Admission medications   Medication Sig Start Date End Date Taking? Authorizing Provider  diphenhydrAMINE (BENADRYL) 25 MG tablet Take 1 tablet (25 mg total) by mouth every 6 (six) hours. 03/27/17 09/23/18 Yes Shaune Pollack, MD  docusate sodium (COLACE) 100 MG capsule Take 1 capsule (100 mg total) by mouth every 12 (twelve) hours. 05/15/17   Arby Barrette, MD  omeprazole (PRILOSEC) 20 MG capsule Take 1 capsule (20 mg total) by mouth  daily. 09/23/18   Debraann Livingstone, PA-C  ondansetron (ZOFRAN) 4 MG tablet Take 1 tablet (4 mg total) by mouth every 8 (eight) hours as needed for nausea or vomiting. 12/30/17   Caccavale, Sophia, PA-C    Family History History reviewed. No pertinent family history.  Social History Social History   Tobacco Use  . Smoking status: Never Smoker  . Smokeless tobacco: Never Used  Substance Use Topics  . Alcohol use: Yes    Comment: occasionally  . Drug use: No     Allergies   Shellfish allergy   Review of Systems Review of Systems  Constitutional: Negative for appetite change, chills and fever.  HENT: Negative for ear pain, rhinorrhea, sneezing and sore throat.   Eyes: Negative for photophobia and visual disturbance.  Respiratory: Negative for cough, chest tightness, shortness of breath and wheezing.   Cardiovascular: Positive for chest pain. Negative for palpitations.  Gastrointestinal: Positive for abdominal pain. Negative for blood in stool, constipation, diarrhea, nausea and vomiting.  Genitourinary: Negative for dysuria, hematuria and urgency.  Musculoskeletal: Negative for myalgias.  Skin: Negative for rash.  Neurological: Negative for dizziness, weakness and light-headedness.     Physical Exam Updated Vital Signs BP 111/76   Pulse 83   Temp 98.4 F (36.9 C) (Oral)   Resp 20   Ht 5\' 3"  (1.6 m)   Wt 68 kg   LMP 08/26/2018   SpO2 100%   BMI  26.57 kg/m   Physical Exam Vitals signs and nursing note reviewed.  Constitutional:      General: She is not in acute distress.    Appearance: She is well-developed.  HENT:     Head: Normocephalic and atraumatic.     Nose: Nose normal.  Eyes:     General: No scleral icterus.       Left eye: No discharge.     Conjunctiva/sclera: Conjunctivae normal.  Neck:     Musculoskeletal: Normal range of motion and neck supple.  Cardiovascular:     Rate and Rhythm: Normal rate and regular rhythm.     Heart sounds: Normal heart  sounds. No murmur. No friction rub. No gallop.   Pulmonary:     Effort: Pulmonary effort is normal. No respiratory distress.     Breath sounds: Normal breath sounds.  Abdominal:     General: Bowel sounds are normal. There is no distension.     Palpations: Abdomen is soft.     Tenderness: There is no abdominal tenderness. There is no guarding.  Musculoskeletal: Normal range of motion.  Skin:    General: Skin is warm and dry.     Findings: No rash.  Neurological:     Mental Status: She is alert.     Motor: No abnormal muscle tone.     Coordination: Coordination normal.      ED Treatments / Results  Labs (all labs ordered are listed, but only abnormal results are displayed) Labs Reviewed  BASIC METABOLIC PANEL - Abnormal; Notable for the following components:      Result Value   Sodium 134 (*)    Calcium 8.8 (*)    All other components within normal limits  HEPATIC FUNCTION PANEL - Abnormal; Notable for the following components:   Alkaline Phosphatase 34 (*)    Total Bilirubin 1.4 (*)    Indirect Bilirubin 1.2 (*)    All other components within normal limits  PREGNANCY, URINE  CBC  TROPONIN I  LIPASE, BLOOD    EKG EKG Interpretation  Date/Time:  Sunday September 23 2018 12:32:51 EST Ventricular Rate:  89 PR Interval:  172 QRS Duration: 70 QT Interval:  336 QTC Calculation: 408 R Axis:   80 Text Interpretation:  Normal sinus rhythm Normal ECG no change from previous Confirmed by Arby BarrettePfeiffer, Marcy [phone]([id number] on 09/23/2018 12:42:33 PM Also confirmed by Arby BarrettePfeiffer, Marcy [phone]), editor Barbette Hairassel, Kerry [phone])  on 09/23/2018 3:09:09 PM   Radiology Dg Chest 2 View  Result Date: 09/23/2018 CLINICAL DATA:  Chest pain beginning this morning. Gastroesophageal reflux disease. EXAM: CHEST - 2 VIEW COMPARISON:  None. FINDINGS: The heart size and mediastinal contours are within normal limits. Both lungs are clear. The visualized skeletal structures are unremarkable. IMPRESSION:  Negative.  No active cardiopulmonary disease. Electronically Signed   By: Myles RosenthalJohn  Stahl M.D.   On: 09/23/2018 13:05    Procedures Procedures (including critical care time)  Medications Ordered in ED Medications  alum & mag hydroxide-simeth (MAALOX/MYLANTA) 200-200-20 MG/5ML suspension 30 mL (30 mLs Oral Given 09/23/18 1457)    And  lidocaine (XYLOCAINE) 2 % viscous mouth solution 15 mL (15 mLs Oral Given 09/23/18 1457)     Initial Impression / Assessment and Plan / ED Course  I have reviewed the triage vital signs and the nursing notes.  Pertinent labs & imaging results that were available during my care of the patient were reviewed by me and considered in my medical decision making (see chart for  details).     27 year old female presents to ED for cramping feeling in her chest and abdomen. The sensation in her chest began this morning and but the abdominal cramping began 3 weeks ago.  Denies any pleuritic chest pain, shortness of breath, recent immobilization, OCP use, leg swelling, fever changes to bowel movements.  On exam she is overall well-appearing.  No lower extremity edema, erythema that would concern me for DVT.  Abdomen is soft, nontender nondistended.  Vital signs are within normal limits.  Plan is to obtain lab work, give GI cocktail and reassess.  Lab work including CBC, CMP, lipase, troponin unremarkable.  Pregnancy test is negative.  Chest x-ray is unremarkable.  EKG shows normal sinus rhythm.  Patient reports improvement in her symptoms with GI cocktail.  She is able to tolerate p.o. intake without difficulty prior to discharge.  Suspect that symptoms are due to gastritis.  She is PERC negative and low risk by heart score.  Will advise her to follow-up with her PCP and to return to ED for any severe worsening symptoms.  Patient is hemodynamically stable, in NAD, and able to ambulate in the ED. Evaluation does not show pathology that would require ongoing emergent intervention  or inpatient treatment. I explained the diagnosis to the patient. Pain has been managed and has no complaints prior to discharge. Patient is comfortable with above plan and is stable for discharge at this time. All questions were answered prior to disposition. Strict return precautions for returning to the ED were discussed. Encouraged follow up with PCP.    Portions of this note were generated with Scientist, clinical (histocompatibility and immunogenetics)Dragon dictation software. Dictation errors may occur despite best attempts at proofreading.  Final Clinical Impressions(s) / ED Diagnoses   Final diagnoses:  Gastritis without bleeding, unspecified chronicity, unspecified gastritis type    ED Discharge Orders         Ordered    omeprazole (PRILOSEC) 20 MG capsule  Daily     09/23/18 1633           Dietrich PatesKhatri, Draysen Weygandt, PA-C 09/23/18 1633    Arby BarrettePfeiffer, Marcy, MD 09/24/18 1545

## 2018-09-23 NOTE — ED Notes (Signed)
Pt talking on cell phone. NAD. Juice given with verbal approval from PA

## 2018-09-23 NOTE — Discharge Instructions (Signed)
Return to ED for worsening symptoms, worsening chest pain abdominal pain, vomiting or coughing up blood, leg swelling.

## 2018-09-23 NOTE — ED Notes (Signed)
Attempted IV to RAC, tol well, blood collected for labs

## 2018-11-22 ENCOUNTER — Emergency Department (HOSPITAL_BASED_OUTPATIENT_CLINIC_OR_DEPARTMENT_OTHER)
Admission: EM | Admit: 2018-11-22 | Discharge: 2018-11-22 | Disposition: A | Discharge status: Home / Self Care | Payer: Medicare Other | Attending: Emergency Medicine | Admitting: Emergency Medicine

## 2018-11-22 ENCOUNTER — Other Ambulatory Visit: Payer: Self-pay

## 2018-11-22 ENCOUNTER — Encounter (HOSPITAL_BASED_OUTPATIENT_CLINIC_OR_DEPARTMENT_OTHER): Payer: Self-pay | Admitting: *Deleted

## 2018-11-22 DIAGNOSIS — N898 Other specified noninflammatory disorders of vagina: Secondary | ICD-10-CM | POA: Insufficient documentation

## 2018-11-22 DIAGNOSIS — Z202 Contact with and (suspected) exposure to infections with a predominantly sexual mode of transmission: Secondary | ICD-10-CM | POA: Diagnosis present

## 2018-11-22 DIAGNOSIS — B9689 Other specified bacterial agents as the cause of diseases classified elsewhere: Secondary | ICD-10-CM | POA: Diagnosis not present

## 2018-11-22 DIAGNOSIS — Z79899 Other long term (current) drug therapy: Secondary | ICD-10-CM | POA: Diagnosis not present

## 2018-11-22 DIAGNOSIS — N760 Acute vaginitis: Secondary | ICD-10-CM | POA: Insufficient documentation

## 2018-11-22 LAB — URINALYSIS, MICROSCOPIC (REFLEX): WBC, UA: NONE SEEN WBC/hpf (ref 0–5)

## 2018-11-22 LAB — URINALYSIS, ROUTINE W REFLEX MICROSCOPIC
Bilirubin Urine: NEGATIVE
Glucose, UA: NEGATIVE mg/dL
Ketones, ur: 40 mg/dL — AB
Leukocytes,Ua: NEGATIVE
Nitrite: NEGATIVE
Protein, ur: NEGATIVE mg/dL
Specific Gravity, Urine: 1.025 (ref 1.005–1.030)
pH: 6 (ref 5.0–8.0)

## 2018-11-22 LAB — PREGNANCY, URINE: Preg Test, Ur: NEGATIVE

## 2018-11-22 LAB — WET PREP, GENITAL
Sperm: NONE SEEN
TRICH WET PREP: NONE SEEN
YEAST WET PREP: NONE SEEN

## 2018-11-22 MED ORDER — METRONIDAZOLE 500 MG PO TABS: 500.0000 mg | ORAL_TABLET | Freq: Two times a day (BID) | ORAL | 0 refills | Status: AC

## 2018-11-22 NOTE — ED Triage Notes (Signed)
Pt thinks that she has genital herpes.  States that she doesn't have an appointment with OBGYN for 2 weeks.

## 2018-11-22 NOTE — ED Notes (Signed)
Pt verbalizes understanding of d/c instructions and denies any further need at this time. 

## 2018-11-22 NOTE — ED Provider Notes (Signed)
[facility] EMERGENCY DEPARTMENT Provider Note   CSN: [id number] Arrival date & time: 11/22/18  1827    History   Chief Complaint Chief Complaint  Patient presents with  . SEXUALLY TRANSMITTED DISEASE    HPI Taylor Stevens is a 28 y.o. female.     The history is provided by the patient and medical records.  Female GU Problem  This is a new problem. The current episode started yesterday. The problem occurs constantly. The problem has not changed since onset.Pertinent negatives include no abdominal pain. Nothing aggravates the symptoms. Nothing relieves the symptoms. She has tried nothing for the symptoms. The treatment provided no relief.    Past Medical History:  Diagnosis Date  . GERD (gastroesophageal reflux disease)     There are no active problems to display for this patient.   History reviewed. No pertinent surgical history.   OB History   No obstetric history on file.      Home Medications    Prior to Admission medications   Medication Sig Start Date End Date Taking? Authorizing Provider  diphenhydrAMINE (BENADRYL) 25 MG tablet Take 1 tablet (25 mg total) by mouth every 6 (six) hours. 03/27/17 09/23/18  Shaune Pollack, MD  docusate sodium (COLACE) 100 MG capsule Take 1 capsule (100 mg total) by mouth every 12 (twelve) hours. 05/15/17   Arby Barrette, MD  metroNIDAZOLE (FLAGYL) 500 MG tablet Take 1 tablet (500 mg total) by mouth 2 (two) times daily for 7 days. 11/22/18 11/29/18  Ivor Kishi, DO  omeprazole (PRILOSEC) 20 MG capsule Take 1 capsule (20 mg total) by mouth daily. 09/23/18   Khatri, Hina, PA-C  ondansetron (ZOFRAN) 4 MG tablet Take 1 tablet (4 mg total) by mouth every 8 (eight) hours as needed for nausea or vomiting. 12/30/17   Caccavale, Sophia, PA-C    Family History History reviewed. No pertinent family history.  Social History Social History   Tobacco Use  . Smoking status: Never Smoker  . Smokeless tobacco: Never Used    Substance Use Topics  . Alcohol use: Yes    Comment: occasionally  . Drug use: No     Allergies   Shellfish allergy   Review of Systems Review of Systems  Constitutional: Negative for chills and fever.  Gastrointestinal: Negative for abdominal pain and vomiting.  Genitourinary: Positive for genital sores and vaginal discharge. Negative for decreased urine volume, difficulty urinating, dyspareunia, dysuria, enuresis, flank pain, frequency, hematuria, urgency, vaginal bleeding and vaginal pain.  Musculoskeletal: Negative for arthralgias and back pain.  Skin: Negative for color change and rash.  Neurological: Negative for seizures and syncope.  All other systems reviewed and are negative.    Physical Exam Updated Vital Signs BP 116/78 (BP Location: Right Arm)   Pulse 94   Temp 97.9 F (36.6 C) (Oral)   Resp 18   Ht 5\' 3"  (1.6 m)   Wt 68 kg   LMP 11/11/2018   SpO2 100%   BMI 26.57 kg/m   Physical Exam Exam conducted with a chaperone present.  Abdominal:     General: There is no distension.     Palpations: There is no mass.     Tenderness: There is no abdominal tenderness. There is no right CVA tenderness, left CVA tenderness or guarding.  Genitourinary:    Pubic Area: No rash or pubic lice.      Vagina: Vaginal discharge (scant) present.     Cervix: No cervical motion tenderness, friability or lesion.  Uterus: Normal.      Adnexa: Right adnexa normal.       Right: No mass, tenderness or fullness.         Left: No mass, tenderness or fullness.       Comments: 2 small bumps on the right lower labia area that is likely genital wart or ingrown hair, no ulcerative lesions     ED Treatments / Results  Labs (all labs ordered are listed, but only abnormal results are displayed) Labs Reviewed  WET PREP, GENITAL - Abnormal; Notable for the following components:      Result Value   Clue Cells Wet Prep HPF POC PRESENT (*)    WBC, Wet Prep HPF POC FEW (*)    All  other components within normal limits  URINALYSIS, ROUTINE W REFLEX MICROSCOPIC - Abnormal; Notable for the following components:   APPearance CLOUDY (*)    Hgb urine dipstick TRACE (*)    Ketones, ur 40 (*)    All other components within normal limits  URINALYSIS, MICROSCOPIC (REFLEX) - Abnormal; Notable for the following components:   Bacteria, UA RARE (*)    All other components within normal limits  PREGNANCY, URINE  GC/CHLAMYDIA PROBE AMP ([place]) NOT AT [facility]    EKG None  Radiology No results found.  Procedures Procedures (including critical care time)  Medications Ordered in ED Medications - No data to display   Initial Impression / Assessment and Plan / ED Course  I have reviewed the triage vital signs and the nursing notes.  Pertinent labs & imaging results that were available during my care of the patient were reviewed by me and considered in my medical decision making (see chart for details).        Taylor Stevens is a 28 year old female with no significant medical history who  Presents to the ED with concern for STD.  Patient with normal vitals.  No fever.  Patient concerned about 2 lesions on the right side of her labia that are not consistent with herpes.  Possible genital wart versus ingrown hair. Hx of HPV. No abscess.  No cellulitis.  Patient has had some scant vaginal discharge.  No concern for new STD with sex partner that she is monogamous with.  Does not want empiric treatment for gonorrhea/chlamydia. No concern for PID on exam. She was positive for bacterial vaginosis and treated with Flagyl.  Will follow-up gonorrhea and Chlamydia testing outpatient.  Does not have any trichomonas.  Will follow up with OB/GYN as well.  Discharged from ED in good condition and given return precautions.  Patient was negative for pregnancy test then no signs of urinary tract infection as well.  This chart was dictated using voice recognition software.  Despite best efforts  to proofread,  errors can occur which can change the documentation meaning.    Final Clinical Impressions(s) / ED Diagnoses   Final diagnoses:  BV (bacterial vaginosis)    ED Discharge Orders         Ordered    metroNIDAZOLE (FLAGYL) 500 MG tablet  2 times daily     11/22/18 1921           Virgina Norfolk, DO 11/22/18 1930

## 2018-11-26 LAB — GC/CHLAMYDIA PROBE AMP (~~LOC~~) NOT AT ARMC
Chlamydia: NEGATIVE
NEISSERIA GONORRHEA: NEGATIVE

## 2019-03-10 ENCOUNTER — Other Ambulatory Visit: Payer: Self-pay

## 2019-03-10 ENCOUNTER — Emergency Department (HOSPITAL_BASED_OUTPATIENT_CLINIC_OR_DEPARTMENT_OTHER)
Admission: EM | Admit: 2019-03-10 | Discharge: 2019-03-10 | Disposition: A | Discharge status: Home / Self Care | Payer: Medicare Other | Attending: Emergency Medicine | Admitting: Emergency Medicine

## 2019-03-10 ENCOUNTER — Encounter (HOSPITAL_BASED_OUTPATIENT_CLINIC_OR_DEPARTMENT_OTHER): Payer: Self-pay | Admitting: *Deleted

## 2019-03-10 DIAGNOSIS — H00015 Hordeolum externum left lower eyelid: Secondary | ICD-10-CM | POA: Insufficient documentation

## 2019-03-10 DIAGNOSIS — H5789 Other specified disorders of eye and adnexa: Secondary | ICD-10-CM | POA: Diagnosis present

## 2019-03-10 DIAGNOSIS — Z79899 Other long term (current) drug therapy: Secondary | ICD-10-CM | POA: Diagnosis not present

## 2019-03-10 HISTORY — DX: Guillain-Barre syndrome: G61.0

## 2019-03-10 NOTE — ED Provider Notes (Signed)
[facility] EMERGENCY DEPARTMENT Provider Note   CSN: [id number] Arrival date & time: 03/10/19  1744    History   Chief Complaint Chief Complaint  Patient presents with  . Eye Problem    HPI Taylor Stevens is a 28 y.o. female.     HPI Patient with swelling redness to the left lower eyelid for the last 2 days.  Denies any new drainage.  No visual changes.  Says she has had some "twitching" to the left eye for several months.  No fever or chills.  No known trauma. Past Medical History:  Diagnosis Date  . GERD (gastroesophageal reflux disease)   . Guillain Barr syndrome (H[facility])     There are no active problems to display for this patient.   History reviewed. No pertinent surgical history.   OB History   No obstetric history on file.      Home Medications    Prior to Admission medications   Medication Sig Start Date End Date Taking? Authorizing Provider  diphenhydrAMINE (BENADRYL) 25 MG tablet Take 1 tablet (25 mg total) by mouth every 6 (six) hours. 03/27/17 09/23/18  Shaune PollackIsaacs, Cameron, MD  docusate sodium (COLACE) 100 MG capsule Take 1 capsule (100 mg total) by mouth every 12 (twelve) hours. 05/15/17   Arby BarrettePfeiffer, Marcy, MD  omeprazole (PRILOSEC) 20 MG capsule Take 1 capsule (20 mg total) by mouth daily. 09/23/18   Khatri, Hina, PA-C  ondansetron (ZOFRAN) 4 MG tablet Take 1 tablet (4 mg total) by mouth every 8 (eight) hours as needed for nausea or vomiting. 12/30/17   Caccavale, Sophia, PA-C    Family History No family history on file.  Social History Social History   Tobacco Use  . Smoking status: Never Smoker  . Smokeless tobacco: Never Used  Substance Use Topics  . Alcohol use: Yes    Comment: occasionally  . Drug use: No     Allergies   Shellfish allergy   Review of Systems Review of Systems  Constitutional: Negative for chills and fever.  HENT: Negative for congestion, rhinorrhea, sinus pressure and sore throat.   Eyes: Negative for pain,  discharge, redness and visual disturbance.  Musculoskeletal: Negative for neck pain and neck stiffness.  Neurological: Negative for weakness, numbness and headaches.  All other systems reviewed and are negative.    Physical Exam Updated Vital Signs BP 105/68 (BP Location: Right Arm)   Pulse 85   Temp 98.4 F (36.9 C) (Oral)   Resp 18   Ht 5\' 2"  (1.575 m)   Wt 68 kg   SpO2 100%   BMI 27.44 kg/m   Physical Exam Vitals signs and nursing note reviewed.  Constitutional:      General: She is not in acute distress.    Appearance: Normal appearance. She is well-developed.  HENT:     Head: Normocephalic and atraumatic.     Nose: Nose normal.     Mouth/Throat:     Mouth: Mucous membranes are moist.  Eyes:     General:        Right eye: No discharge.        Left eye: No discharge.     Extraocular Movements: Extraocular movements intact.     Conjunctiva/sclera: Conjunctivae normal.     Pupils: Pupils are equal, round, and reactive to light.     Comments: Patient with with left lower medial eyelid swelling consistent with hordeolum.  Possible inflammation of the lacrimal gland as well..  Neck:  Musculoskeletal: Normal range of motion and neck supple. No neck rigidity or muscular tenderness.  Cardiovascular:     Rate and Rhythm: Normal rate.  Pulmonary:     Effort: Pulmonary effort is normal.  Abdominal:     Palpations: Abdomen is soft.  Musculoskeletal: Normal range of motion.        General: No tenderness.  Lymphadenopathy:     Cervical: No cervical adenopathy.  Skin:    General: Skin is warm and dry.     Findings: No erythema or rash.  Neurological:     General: No focal deficit present.     Mental Status: She is alert and oriented to person, place, and time.  Psychiatric:        Behavior: Behavior normal.      ED Treatments / Results  Labs (all labs ordered are listed, but only abnormal results are displayed) Labs Reviewed - No data to display  EKG None   Radiology No results found.  Procedures Procedures (including critical care time)  Medications Ordered in ED Medications - No data to display   Initial Impression / Assessment and Plan / ED Course  I have reviewed the triage vital signs and the nursing notes.  Pertinent labs & imaging results that were available during my care of the patient were reviewed by me and considered in my medical decision making (see chart for details).        Recommended warm compresses.  If symptoms not improving have encouraged her to follow-up with an ophthalmologist.  Return precautions given.  Final Clinical Impressions(s) / ED Diagnoses   Final diagnoses:  Hordeolum of left lower eyelid, unspecified hordeolum type    ED Discharge Orders    None       Julianne Rice, MD 03/10/19 1831

## 2019-03-10 NOTE — ED Notes (Signed)
ED Provider at bedside. 

## 2019-03-10 NOTE — ED Triage Notes (Signed)
Pt reports left eye twitching x 3 months. Last Wednesday she states she had pain behind the eye but resolved. Yesterday she began having redness and swelling around her eye.

## 2019-03-29 ENCOUNTER — Other Ambulatory Visit: Payer: Self-pay

## 2019-03-29 ENCOUNTER — Encounter (HOSPITAL_BASED_OUTPATIENT_CLINIC_OR_DEPARTMENT_OTHER): Payer: Self-pay | Admitting: Emergency Medicine

## 2019-03-29 ENCOUNTER — Emergency Department (HOSPITAL_BASED_OUTPATIENT_CLINIC_OR_DEPARTMENT_OTHER)
Admission: EM | Admit: 2019-03-29 | Discharge: 2019-03-29 | Disposition: A | Discharge status: Home / Self Care | Payer: Medicare Other | Attending: Emergency Medicine | Admitting: Emergency Medicine

## 2019-03-29 DIAGNOSIS — S00412A Abrasion of left ear, initial encounter: Secondary | ICD-10-CM | POA: Insufficient documentation

## 2019-03-29 DIAGNOSIS — Y929 Unspecified place or not applicable: Secondary | ICD-10-CM | POA: Insufficient documentation

## 2019-03-29 DIAGNOSIS — T148XXA Other injury of unspecified body region, initial encounter: Secondary | ICD-10-CM

## 2019-03-29 DIAGNOSIS — Z79899 Other long term (current) drug therapy: Secondary | ICD-10-CM | POA: Insufficient documentation

## 2019-03-29 DIAGNOSIS — S09302A Unspecified injury of left middle and inner ear, initial encounter: Secondary | ICD-10-CM | POA: Diagnosis present

## 2019-03-29 DIAGNOSIS — X58XXXA Exposure to other specified factors, initial encounter: Secondary | ICD-10-CM | POA: Insufficient documentation

## 2019-03-29 DIAGNOSIS — Y93E8 Activity, other personal hygiene: Secondary | ICD-10-CM | POA: Insufficient documentation

## 2019-03-29 DIAGNOSIS — Y998 Other external cause status: Secondary | ICD-10-CM | POA: Diagnosis not present

## 2019-03-29 MED ORDER — ACETAMINOPHEN 500 MG PO TABS: 1000.0000 mg | ORAL_TABLET | Freq: Once | ORAL | Status: DC

## 2019-03-29 MED ORDER — ACETAMINOPHEN 500 MG PO TABS
ORAL_TABLET | ORAL | Status: AC
  Filled 2019-03-29: qty 2

## 2019-03-29 NOTE — ED Triage Notes (Signed)
Pt states that she noticed tonight that her left ear was bleeding. Pain when she touches it . States she felt there was a bump in the canal.

## 2019-03-29 NOTE — ED Provider Notes (Signed)
[facility] Provider Note   CSN: [id number] Arrival date & time: 03/29/19  0047     History   Chief Complaint Chief Complaint  Patient presents with  . Otalgia    HPI Taylor Stevens is a 28 y.o. female.     The history is provided by the patient.  Otalgia Location:  Left Behind ear:  No abnormality Quality:  Aching Severity:  Mild Onset quality:  Sudden Duration:  0 hours Timing:  Constant Progression:  Unchanged Chronicity:  New Context comment:  Felt something and put Q tip in ear saw blood on Qtip Relieved by:  Nothing Worsened by:  Nothing Ineffective treatments:  None tried Associated symptoms: no abdominal pain, no cough, no diarrhea, no ear discharge, no fever and no rhinorrhea   Risk factors: no recent travel     Past Medical History:  Diagnosis Date  . GERD (gastroesophageal reflux disease)   . Guillain Barr syndrome ([place])     There are no active problems to display for this patient.   History reviewed. No pertinent surgical history.   OB History   No obstetric history on file.      Home Medications    Prior to Admission medications   Medication Sig Start Date End Date Taking? Authorizing Provider  diphenhydrAMINE (BENADRYL) 25 MG tablet Take 1 tablet (25 mg total) by mouth every 6 (six) hours. 03/27/17 09/23/18  Duffy Bruce, MD  docusate sodium (COLACE) 100 MG capsule Take 1 capsule (100 mg total) by mouth every 12 (twelve) hours. 05/15/17   Charlesetta Shanks, MD  omeprazole (PRILOSEC) 20 MG capsule Take 1 capsule (20 mg total) by mouth daily. 09/23/18   Khatri, Hina, PA-C  ondansetron (ZOFRAN) 4 MG tablet Take 1 tablet (4 mg total) by mouth every 8 (eight) hours as needed for nausea or vomiting. 12/30/17   Caccavale, Sophia, PA-C    Family History No family history on file.  Social History Social History   Tobacco Use  . Smoking status: Never Smoker  . Smokeless tobacco: Never Used  Substance Use  Topics  . Alcohol use: Yes    Comment: occasionally  . Drug use: No     Allergies   Shellfish allergy   Review of Systems Review of Systems  Constitutional: Negative for fever.  HENT: Positive for ear pain. Negative for ear discharge and rhinorrhea.   Respiratory: Negative for cough and shortness of breath.   Cardiovascular: Negative for chest pain.  Gastrointestinal: Negative for abdominal pain and diarrhea.  All other systems reviewed and are negative.    Physical Exam Updated Vital Signs BP 108/78   Pulse 87   Temp 98.3 F (36.8 C) (Oral)   Resp 18   Ht 5\' 3"  (1.6 m)   Wt 68 kg   LMP 03/15/2019 (Approximate)   SpO2 97%   BMI 26.57 kg/m   Physical Exam Vitals signs and nursing note reviewed.  Constitutional:      General: She is not in acute distress.    Appearance: She is normal weight.  HENT:     Head: Normocephalic and atraumatic.     Right Ear: Tympanic membrane normal.     Ears:     Comments: Small abrasion to floor of left canal     Nose: Nose normal.  Eyes:     Conjunctiva/sclera: Conjunctivae normal.     Pupils: Pupils are equal, round, and reactive to light.  Neck:     Musculoskeletal: Normal range  of motion and neck supple.  Cardiovascular:     Rate and Rhythm: Normal rate and regular rhythm.     Pulses: Normal pulses.     Heart sounds: Normal heart sounds.  Pulmonary:     Effort: Pulmonary effort is normal.     Breath sounds: Normal breath sounds. No stridor.  Abdominal:     General: Abdomen is flat. Bowel sounds are normal.     Tenderness: There is no abdominal tenderness.  Musculoskeletal: Normal range of motion.  Skin:    General: Skin is warm and dry.     Capillary Refill: Capillary refill takes less than 2 seconds.  Neurological:     General: No focal deficit present.     Mental Status: She is alert and oriented to person, place, and time.  Psychiatric:        Mood and Affect: Mood normal.        Behavior: Behavior normal.       ED Treatments / Results  Labs (all labs ordered are listed, but only abnormal results are displayed) Labs Reviewed - No data to display  EKG    Radiology No results found.  Procedures Procedures (including critical care time)  Medications Ordered in ED Medications  acetaminophen (TYLENOL) tablet 1,000 mg (has no administration in time range)     No Qtips in the ears  Final Clinical Impressions(s) / ED Diagnoses   Return for intractable cough, coughing up blood,fevers >100.4 unrelieved by medication, shortness of breath, intractable vomiting, chest pain, shortness of breath, weakness,numbness, changes in speech, facial asymmetry,abdominal pain, passing out,Inability to tolerate liquids or food, cough, altered mental status or any concerns. No signs of systemic illness or infection. The patient is nontoxic-appearing on exam and vital signs are within normal limits.   I have reviewed the triage vital signs and the nursing notes. Pertinent labs &imaging results that were available during my care of the patient were reviewed by me and considered in my medical decision making (see chart for details).  After history, exam, and medical workup I feel the patient has been appropriately medically screened and is safe for discharge home. Pertinent diagnoses were discussed with the patient. Patient was given return precautions    Kyel Purk, MD 03/29/19 [id number]

## 2019-03-29 NOTE — ED Notes (Signed)
Pt understood dc material. NAD noted. All questions answered to satisfaction. Pt and family member escorted to Geophysicist/field seismologist.

## 2019-08-16 ENCOUNTER — Emergency Department (HOSPITAL_BASED_OUTPATIENT_CLINIC_OR_DEPARTMENT_OTHER)
Admission: EM | Admit: 2019-08-16 | Discharge: 2019-08-16 | Discharge status: Against Medical Advice | Payer: Medicare Other

## 2019-08-16 ENCOUNTER — Other Ambulatory Visit: Payer: Self-pay

## 2019-11-08 ENCOUNTER — Encounter (HOSPITAL_BASED_OUTPATIENT_CLINIC_OR_DEPARTMENT_OTHER): Payer: Self-pay | Admitting: *Deleted

## 2019-11-08 ENCOUNTER — Other Ambulatory Visit: Payer: Self-pay

## 2019-11-08 ENCOUNTER — Emergency Department (HOSPITAL_BASED_OUTPATIENT_CLINIC_OR_DEPARTMENT_OTHER)
Admission: EM | Admit: 2019-11-08 | Discharge: 2019-11-08 | Disposition: A | Discharge status: Home / Self Care | Payer: Medicare Other | Attending: Emergency Medicine | Admitting: Emergency Medicine

## 2019-11-08 DIAGNOSIS — G514 Facial myokymia: Secondary | ICD-10-CM | POA: Insufficient documentation

## 2019-11-08 DIAGNOSIS — Z79899 Other long term (current) drug therapy: Secondary | ICD-10-CM | POA: Diagnosis not present

## 2019-11-08 DIAGNOSIS — H5712 Ocular pain, left eye: Secondary | ICD-10-CM | POA: Diagnosis present

## 2019-11-08 DIAGNOSIS — G479 Sleep disorder, unspecified: Secondary | ICD-10-CM | POA: Insufficient documentation

## 2019-11-08 DIAGNOSIS — H00024 Hordeolum internum left upper eyelid: Secondary | ICD-10-CM | POA: Diagnosis not present

## 2019-11-08 MED ORDER — ERYTHROMYCIN 5 MG/GM OP OINT: TOPICAL_OINTMENT | OPHTHALMIC | 0 refills | Status: DC

## 2019-11-08 MED ORDER — HYDROXYZINE HCL 25 MG PO TABS: 25.0000 mg | ORAL_TABLET | Freq: Every evening | ORAL | 0 refills | Status: DC | PRN

## 2019-11-08 NOTE — ED Triage Notes (Signed)
For a year she has had twitching in her face. Her left eye lid has been swelling. She has a neurology appointment in March. She is afraid she has a tumor. Hx of Guillian Barre". Her last CT of her head was 2 years ago and was normal.

## 2019-11-08 NOTE — Discharge Instructions (Addendum)
Take Vistaril as needed as prescribed for sleep if needed.  Do not drive or machinery while taking Vistaril. Apply erythromycin ointment to your left eye.  Apply warm compresses to your left eye. Consider taking a vitamin B12 every day for facial twitching and follow-up with your neurologist as planned.

## 2019-11-08 NOTE — ED Provider Notes (Signed)
[facility] EMERGENCY DEPARTMENT Provider Note   CSN: [id number] Arrival date & time: 11/08/19  1150     History Chief Complaint  Patient presents with  . Eye Problem    Taylor Stevens is a 29 y.o. female.  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  Patient states for the past year she has been having twitching in her eyes, nose, mouth areas.  Patient states that she is not sleeping well at night due to fears she has a brain tumor after looking up her symptoms on the Internet.  Patient has an appoint with neurology, is scheduled to be seen in 1 month.  Patient denies changes in vision, weakness, numbness, difficulty ambulating, visual changes.  Patient has tried taking melatonin to help her sleep but states this does not help.  Also requests medication to help with her reflux no other complaints or concerns.        Past Medical History:  Diagnosis Date  . GERD (gastroesophageal reflux disease)   . Guillain Barr syndrome ([place])     There are no problems to display for this patient.   History reviewed. No pertinent surgical history.   OB History   No obstetric history on file.     No family history on file.  Social History   Tobacco Use  . Smoking status: Never Smoker  . Smokeless tobacco: Never Used  Substance Use Topics  . Alcohol use: Yes    Comment: occasionally  . Drug use: No    Home Medications Prior to Admission medications   Medication Sig Start Date End Date Taking? Authorizing Provider  diphenhydrAMINE (BENADRYL) 25 MG tablet Take 1 tablet (25 mg total) by mouth every 6 (six) hours. 03/27/17 09/23/18  Duffy Bruce, MD  docusate sodium (COLACE) 100 MG capsule Take 1 capsule (100 mg total) by mouth every 12 (twelve) hours. 05/15/17   Charlesetta Shanks, MD  erythromycin ophthalmic ointment Place a 1/2 inch ribbon of ointment into the lower eyelid. 11/08/19    Tacy Learn, PA-C  hydrOXYzine (ATARAX/VISTARIL) 25 MG tablet Take 1 tablet (25 mg total) by mouth at bedtime as needed. 11/08/19   Tacy Learn, PA-C  omeprazole (PRILOSEC) 20 MG capsule Take 1 capsule (20 mg total) by mouth daily. 09/23/18   Khatri, Hina, PA-C  ondansetron (ZOFRAN) 4 MG tablet Take 1 tablet (4 mg total) by mouth every 8 (eight) hours as needed for nausea or vomiting. 12/30/17   Caccavale, Sophia, PA-C    Allergies    Shellfish allergy  Review of Systems   Review of Systems  Constitutional: Negative for fever.  Eyes: Negative for pain, redness and visual disturbance.  Respiratory: Negative for shortness of breath.   Cardiovascular: Negative for chest pain.  Gastrointestinal: Negative for abdominal pain, nausea and vomiting.  Skin: Negative for rash and wound.  Allergic/Immunologic: Negative for immunocompromised state.  Neurological: Negative for facial asymmetry, speech difficulty, weakness, numbness and headaches.  All other systems reviewed and are negative.   Physical Exam Updated Vital Signs BP 124/77   Pulse 76   Temp 99.1 F (37.3 C) (Oral)   Resp 18   Ht 5\' 3"  (1.6 m)   Wt 72.6 kg   SpO2 98%   BMI 28.34 kg/m   Physical Exam Vitals and nursing note reviewed.  Constitutional:      General: She is not in acute distress.    Appearance: She is  well-developed. She is not diaphoretic.  HENT:     Head: Normocephalic and atraumatic.      Nose: Nose normal.     Mouth/Throat:     Mouth: Mucous membranes are moist.  Eyes:     Extraocular Movements: Extraocular movements intact.     Pupils: Pupils are equal, round, and reactive to light.  Pulmonary:     Effort: Pulmonary effort is normal.  Musculoskeletal:     Cervical back: Neck supple. No tenderness.  Skin:    General: Skin is warm and dry.     Findings: No lesion or rash.  Neurological:     Mental Status: She is alert and oriented to person, place, and time.     Cranial Nerves: No cranial  nerve deficit.     Sensory: No sensory deficit.     Motor: No weakness.     Coordination: Coordination normal.     Gait: Gait normal.     Deep Tendon Reflexes: Reflexes normal.  Psychiatric:        Behavior: Behavior normal.     ED Results / Procedures / Treatments   Labs (all labs ordered are listed, but only abnormal results are displayed) Labs Reviewed - No data to display  EKG None  Radiology No results found.  Procedures Procedures (including critical care time)  Medications Ordered in ED Medications - No data to display  ED Course  I have reviewed the triage vital signs and the nursing notes.  Pertinent labs & imaging results that were available during my care of the patient were reviewed by me and considered in my medical decision making (see chart for details).  Clinical Course as of Nov 07 1322  Fri Nov 08, 2019  [age] year old female with complaint of left and right side facial twitching affecting her eyes, nose, mouth for the past year.  On exam patient has mild swelling, redness to her left upper eyelid, no tenderness, no visible stye.  Neuro exam is unremarkable.  Patient is scheduled to see neurology in 1 month, had a negative head CT in February 2019.  With her normal exam today and symptoms for the past year, do not suspect emergent condition at this time however encouraged her to follow-up with the neurologist as scheduled.  Patient requests refill of her Prilosec, advised this is available over-the-counter.  Patient also request medication to help her sleep, states that melatonin does not help and she is anxious due to her facial twitching and unable to sleep.  Patient was given prescription for Atarax to take as needed for sleep, do not take if driving.  Patient verbalizes understanding of discharge instructions and plan.  Case was discussed with Dr. Adela Lank, ER attending, agrees with plan of care.   [LM]    Clinical Course User Index [LM] Alden Hipp   MDM Rules/Calculators/A&P                      Final Clinical Impression(s) / ED Diagnoses Final diagnoses:  Hordeolum internum of left upper eyelid  Facial twitching  Sleep disturbance    Rx / DC Orders ED Discharge Orders         Ordered    hydrOXYzine (ATARAX/VISTARIL) 25 MG tablet  At bedtime PRN     11/08/19 1247    erythromycin ophthalmic ointment     11/08/19 1247           Jeannie Fend, PA-C 11/08/19 1324  Melene Plan, DO 11/08/19 1359

## 2020-01-20 ENCOUNTER — Encounter (HOSPITAL_BASED_OUTPATIENT_CLINIC_OR_DEPARTMENT_OTHER): Payer: Self-pay | Admitting: *Deleted

## 2020-01-20 ENCOUNTER — Other Ambulatory Visit: Payer: Self-pay

## 2020-01-20 ENCOUNTER — Emergency Department (HOSPITAL_BASED_OUTPATIENT_CLINIC_OR_DEPARTMENT_OTHER)
Admission: EM | Admit: 2020-01-20 | Discharge: 2020-01-20 | Disposition: A | Discharge status: Home / Self Care | Payer: Medicare Other | Attending: Emergency Medicine | Admitting: Emergency Medicine

## 2020-01-20 DIAGNOSIS — R519 Headache, unspecified: Secondary | ICD-10-CM | POA: Diagnosis present

## 2020-01-20 DIAGNOSIS — K625 Hemorrhage of anus and rectum: Secondary | ICD-10-CM | POA: Insufficient documentation

## 2020-01-20 DIAGNOSIS — G44209 Tension-type headache, unspecified, not intractable: Secondary | ICD-10-CM | POA: Insufficient documentation

## 2020-01-20 LAB — CBC WITH DIFFERENTIAL/PLATELET
Abs Immature Granulocytes: 0.02 10*3/uL (ref 0.00–0.07)
Basophils Absolute: 0 10*3/uL (ref 0.0–0.1)
Basophils Relative: 0 %
Eosinophils Absolute: 0.1 10*3/uL (ref 0.0–0.5)
Eosinophils Relative: 2 %
HCT: 41.1 % (ref 36.0–46.0)
Hemoglobin: 13.6 g/dL (ref 12.0–15.0)
Immature Granulocytes: 0 %
Lymphocytes Relative: 21 %
Lymphs Abs: 1.4 10*3/uL (ref 0.7–4.0)
MCH: 32.5 pg (ref 26.0–34.0)
MCHC: 33.1 g/dL (ref 30.0–36.0)
MCV: 98.3 fL (ref 80.0–100.0)
Monocytes Absolute: 0.7 10*3/uL (ref 0.1–1.0)
Monocytes Relative: 10 %
Neutro Abs: 4.7 10*3/uL (ref 1.7–7.7)
Neutrophils Relative %: 67 %
Platelets: 235 10*3/uL (ref 150–400)
RBC: 4.18 MIL/uL (ref 3.87–5.11)
RDW: 11.7 % (ref 11.5–15.5)
WBC: 6.9 10*3/uL (ref 4.0–10.5)
nRBC: 0 % (ref 0.0–0.2)

## 2020-01-20 LAB — URINALYSIS, ROUTINE W REFLEX MICROSCOPIC
Bilirubin Urine: NEGATIVE
Glucose, UA: NEGATIVE mg/dL
Ketones, ur: NEGATIVE mg/dL
Leukocytes,Ua: NEGATIVE
Nitrite: NEGATIVE
Protein, ur: NEGATIVE mg/dL
Specific Gravity, Urine: >1.03 — ABNORMAL HIGH (ref 1.005–1.030)
pH: 6 (ref 5.0–8.0)

## 2020-01-20 LAB — BASIC METABOLIC PANEL
Anion gap: 9 (ref 5–15)
BUN: 18 mg/dL (ref 6–20)
CO2: 25 mmol/L (ref 22–32)
Calcium: 8.8 mg/dL — ABNORMAL LOW (ref 8.9–10.3)
Chloride: 101 mmol/L (ref 98–111)
Creatinine, Ser: 0.83 mg/dL (ref 0.44–1.00)
GFR calc Af Amer: >60 mL/min (ref >60)
GFR calc non Af Amer: >60 mL/min (ref >60)
Glucose, Bld: 96 mg/dL (ref 70–99)
Potassium: 3.4 mmol/L — ABNORMAL LOW (ref 3.5–5.1)
Sodium: 135 mmol/L (ref 135–145)

## 2020-01-20 LAB — URINALYSIS, MICROSCOPIC (REFLEX)

## 2020-01-20 LAB — OCCULT BLOOD X 1 CARD TO LAB, STOOL: Fecal Occult Bld: POSITIVE — AB

## 2020-01-20 MED ORDER — ACETAMINOPHEN 325 MG PO TABS
650.0000 mg | ORAL_TABLET | Freq: Once | ORAL | Status: AC
  Administered 2020-01-20: 650 mg via ORAL
  Filled 2020-01-20: qty 2

## 2020-01-20 MED ORDER — METOCLOPRAMIDE HCL 5 MG/ML IJ SOLN
5.0000 mg | Freq: Once | INTRAMUSCULAR | Status: DC
  Filled 2020-01-20: qty 2

## 2020-01-20 MED ORDER — DIPHENHYDRAMINE HCL 50 MG/ML IJ SOLN
25.0000 mg | Freq: Once | INTRAMUSCULAR | Status: DC
  Filled 2020-01-20: qty 1

## 2020-01-20 NOTE — ED Provider Notes (Signed)
[facility] EMERGENCY DEPARTMENT Provider Note   CSN: [id number] Arrival date & time: 01/20/20  0414     History Chief Complaint  Patient presents with  . headache and rectal bleeding    Taylor Stevens is a 29 y.o. female.  HPI     This is a 29 year old female who presents with headache and rectal bleeding.  Patient reports that she woke up yesterday morning out of sleep with a bitemporal headache.  She also reports left-sided neck stiffness.  She took a Scientist, product/process development powder without relief.  She states she had a normal stool yesterday morning but throughout the day she noted that when she urinated she felt like she had blood coming from her rectum.  She was not having a bowel movement at that time.  She states it was bright red.  She has not had any pain with stooling.  No noted hemorrhoids.  No history of GI bleeds.  Denies any dizziness.  Regarding her headache she rates her pain at 10 out of 10.  She denies any fevers.  It is mostly bitemporal and in her neck.  No known history of migraines.  Denies photophobia, weakness, numbness, strokelike symptoms.  Denies any recent upper respiratory symptoms or Covid symptoms.  No known sick contacts.  Past Medical History:  Diagnosis Date  . GERD (gastroesophageal reflux disease)   . Guillain Barr syndrome (HCC)     There are no problems to display for this patient.   History reviewed. No pertinent surgical history.   OB History   No obstetric history on file.     No family history on file.  Social History   Tobacco Use  . Smoking status: Never Smoker  . Smokeless tobacco: Never Used  Substance Use Topics  . Alcohol use: Yes    Comment: occasionally  . Drug use: No    Home Medications Prior to Admission medications   Medication Sig Start Date End Date Taking? Authorizing Provider  ferrous sulfate 325 (65 FE) MG tablet Take 325 mg by mouth daily with breakfast.   Yes [provider]  hydrOXYzine  (ATARAX/VISTARIL) 25 MG tablet Take 25 mg by mouth 3 (three) times daily as needed.   Yes [provider]  VITAMIN D, CHOLECALCIFEROL, PO Take by mouth.   Yes [provider]  diphenhydrAMINE (BENADRYL) 25 MG tablet Take 1 tablet (25 mg total) by mouth every 6 (six) hours. 03/27/17 09/23/18  Shaune Pollack, MD  docusate sodium (COLACE) 100 MG capsule Take 1 capsule (100 mg total) by mouth every 12 (twelve) hours. 05/15/17   Arby Barrette, MD  erythromycin ophthalmic ointment Place a 1/2 inch ribbon of ointment into the lower eyelid. 11/08/19   Jeannie Fend, PA-C  hydrOXYzine (ATARAX/VISTARIL) 25 MG tablet Take 1 tablet (25 mg total) by mouth at bedtime as needed. 11/08/19   Jeannie Fend, PA-C  omeprazole (PRILOSEC) 20 MG capsule Take 1 capsule (20 mg total) by mouth daily. 09/23/18   Khatri, Hina, PA-C  ondansetron (ZOFRAN) 4 MG tablet Take 1 tablet (4 mg total) by mouth every 8 (eight) hours as needed for nausea or vomiting. 12/30/17   Caccavale, Sophia, PA-C    Allergies    Shellfish allergy  Review of Systems   Review of Systems  Constitutional: Negative for fever.  Respiratory: Negative for shortness of breath.   Cardiovascular: Negative for chest pain.  Gastrointestinal: Positive for anal bleeding. Negative for abdominal pain, blood in stool, diarrhea, nausea and vomiting.  Genitourinary: Negative for dysuria and hematuria.  Neurological: Positive for headaches. Negative for dizziness and weakness.  All other systems reviewed and are negative.   Physical Exam Updated Vital Signs BP 117/78 (BP Location: Right Arm)   Pulse 93   Temp 97.9 F (36.6 C) (Oral)   Resp 20   Ht 1.6 m (5\' 3" )   Wt 81.6 kg   SpO2 99%   BMI 31.89 kg/m   Physical Exam Vitals and nursing note reviewed.  Constitutional:      Appearance: She is well-developed. She is not ill-appearing.     Comments: Anxious appearing but nontoxic  HENT:     Head: Normocephalic and atraumatic.      Nose: Nose normal.     Mouth/Throat:     Mouth: Mucous membranes are moist.  Eyes:     Pupils: Pupils are equal, round, and reactive to light.  Cardiovascular:     Rate and Rhythm: Normal rate and regular rhythm.     Heart sounds: Normal heart sounds.  Pulmonary:     Effort: Pulmonary effort is normal. No respiratory distress.     Breath sounds: No wheezing.  Abdominal:     General: Bowel sounds are normal.     Palpations: Abdomen is soft.     Tenderness: There is no abdominal tenderness. There is no rebound.  Genitourinary:    Rectum: Guaiac result positive.     Comments: Normal external exam, no obvious hemorrhoids, no gross blood on rectal exam Musculoskeletal:     Cervical back: Normal range of motion and neck supple. Tenderness present.     Right lower leg: No edema.     Left lower leg: No edema.  Skin:    General: Skin is warm and dry.  Neurological:     Mental Status: She is alert and oriented to person, place, and time.     Comments: Cranial nerves II through XII intact, 5 out of 5 strength in all 4 extremities, no dysmetria to finger-nose-finger, no drift, normal gait     ED Results / Procedures / Treatments   Labs (all labs ordered are listed, but only abnormal results are displayed) Labs Reviewed  URINALYSIS, ROUTINE W REFLEX MICROSCOPIC - Abnormal; Notable for the following components:      Result Value   Specific Gravity, Urine >1.030 (*)    Hgb urine dipstick TRACE (*)    All other components within normal limits  BASIC METABOLIC PANEL - Abnormal; Notable for the following components:   Potassium 3.4 (*)    Calcium 8.8 (*)    All other components within normal limits  URINALYSIS, MICROSCOPIC (REFLEX) - Abnormal; Notable for the following components:   Bacteria, UA FEW (*)    All other components within normal limits  OCCULT BLOOD X 1 CARD TO LAB, STOOL - Abnormal; Notable for the following components:   Fecal Occult Bld POSITIVE (*)    All other  components within normal limits  CBC WITH DIFFERENTIAL/PLATELET  POC OCCULT BLOOD, ED    EKG None  Radiology No results found.  Procedures Procedures (including critical care time)  Medications Ordered in ED Medications  diphenhydrAMINE (BENADRYL) injection 25 mg (25 mg Intravenous Not Given 01/20/20 0531)  metoCLOPramide (REGLAN) injection 5 mg (5 mg Intravenous Not Given 01/20/20 0531)  acetaminophen (TYLENOL) tablet 650 mg (650 mg Oral Given 01/20/20 01/22/20)    ED Course  I have reviewed the triage vital signs and the nursing notes.  Pertinent labs &  imaging results that were available during my care of the patient were reviewed by me and considered in my medical decision making (see chart for details).    MDM Rules/Calculators/A&P                       Patient presents with several complaints including headache and rectal bleeding.  She is overall nontoxic and vital signs are reassuring.  She is neurologically intact.  She has no significant headache history but does not have any red flags either.  She has normal range of motion of neck.  Doubt subarachnoid hemorrhage or meningitis.  She does have some left-sided neck tenderness that may suggest some tension component.  Migraine cocktail was ordered but patient refused.  After multiple reassurances, patient stated that she did not want IV medication.  She was given 650 of Tylenol.  We will try to avoid NSAIDs given reports of rectal bleeding.  Basic lab work obtained.  She does not have any obvious rectal bleeding on exam and no external hemorrhoids.  Hemoccult is positive but her hemoglobin is stable and within normal range.  No acute bleeding.  She could have internal hemorrhoids or an occult process.  Given that she has no obvious ongoing or significant bleeding at this time, feel she is safe for follow-up with gastroenterology.  Patient on multiple rechecks appears very anxious about her symptoms.  She was reassured.  I did instruct  her to avoid NSAIDs and follow-up closely with gastroenterology.  Patient states understanding.  After history, exam, and medical workup I feel the patient has been appropriately medically screened and is safe for discharge home. Pertinent diagnoses were discussed with the patient. Patient was given return precautions.   Final Clinical Impression(s) / ED Diagnoses Final diagnoses:  Tension headache  Rectal bleeding    Rx / DC Orders ED Discharge Orders    None       Jamai Dolce, Barbette Hair, MD 01/20/20 [phone]

## 2020-01-20 NOTE — ED Notes (Addendum)
Pt states she is anxious to take medications prescribed in the ED. RN explained to pt that medications are safe and that she would be monitored by staff for any adverse outcome. Pt states she would rather "take a Benadryl when I get home" despite RN reassurance. EDP made aware.

## 2020-01-20 NOTE — ED Notes (Signed)
ED Provider at bedside. 

## 2020-01-20 NOTE — Discharge Instructions (Addendum)
You were seen today for headache and rectal bleeding.  Your work-up is reassuring.  You are not anemic.  There is no obvious source of bleeding on exam.  You are not actively bleeding.  If you find that you have recurrent episodes of bleeding, you need to follow-up with gastroenterology.  Avoid anti-inflammatory medications such as ibuprofen or Aleve.  You may take Tylenol for any pain.

## 2020-01-20 NOTE — ED Triage Notes (Signed)
C/o general h/a that started this morning. States the pain woke her out of her sleep. C/o posterior neck pain. States she took a goody powder without relief. Also c/o of rectal bleeding that started Sunday. States blood is bright red. Denies hx of hemorrhoids. Denies any n/v/d or abd pain.

## 2020-02-09 ENCOUNTER — Emergency Department (HOSPITAL_BASED_OUTPATIENT_CLINIC_OR_DEPARTMENT_OTHER)
Admission: EM | Admit: 2020-02-09 | Discharge: 2020-02-09 | Disposition: A | Discharge status: Home / Self Care | Payer: Medicare Other | Attending: Emergency Medicine | Admitting: Emergency Medicine

## 2020-02-09 ENCOUNTER — Other Ambulatory Visit: Payer: Self-pay

## 2020-02-09 ENCOUNTER — Encounter (HOSPITAL_BASED_OUTPATIENT_CLINIC_OR_DEPARTMENT_OTHER): Payer: Self-pay | Admitting: Emergency Medicine

## 2020-02-09 DIAGNOSIS — F410 Panic disorder [episodic paroxysmal anxiety] without agoraphobia: Secondary | ICD-10-CM | POA: Insufficient documentation

## 2020-02-09 DIAGNOSIS — R0602 Shortness of breath: Secondary | ICD-10-CM | POA: Diagnosis not present

## 2020-02-09 DIAGNOSIS — Z79899 Other long term (current) drug therapy: Secondary | ICD-10-CM | POA: Insufficient documentation

## 2020-02-09 DIAGNOSIS — T7611XA Adult physical abuse, suspected, initial encounter: Secondary | ICD-10-CM | POA: Diagnosis not present

## 2020-02-09 DIAGNOSIS — Z91013 Allergy to seafood: Secondary | ICD-10-CM | POA: Diagnosis not present
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
## 2020-05-06 ENCOUNTER — Other Ambulatory Visit: Payer: Self-pay

## 2020-05-06 ENCOUNTER — Encounter (HOSPITAL_BASED_OUTPATIENT_CLINIC_OR_DEPARTMENT_OTHER): Payer: Self-pay | Admitting: Emergency Medicine

## 2020-05-06 ENCOUNTER — Emergency Department (HOSPITAL_BASED_OUTPATIENT_CLINIC_OR_DEPARTMENT_OTHER)
Admission: EM | Admit: 2020-05-06 | Discharge: 2020-05-06 | Disposition: A | Discharge status: Home / Self Care | Payer: Medicare Other | Attending: Emergency Medicine | Admitting: Emergency Medicine

## 2020-05-06 DIAGNOSIS — R070 Pain in throat: Secondary | ICD-10-CM | POA: Diagnosis present

## 2020-05-06 DIAGNOSIS — Z20822 Contact with and (suspected) exposure to covid-19: Secondary | ICD-10-CM | POA: Diagnosis not present

## 2020-05-06 DIAGNOSIS — J029 Acute pharyngitis, unspecified: Secondary | ICD-10-CM | POA: Diagnosis not present

## 2020-05-06 LAB — GROUP A STREP BY PCR: Group A Strep by PCR: NOT DETECTED

## 2020-05-06 LAB — SARS CORONAVIRUS 2 BY RT PCR (HOSPITAL ORDER, PERFORMED IN ~~LOC~~ HOSPITAL LAB): SARS Coronavirus 2: NEGATIVE

## 2020-05-06 NOTE — ED Triage Notes (Signed)
Pt states last week her ear was itching and a few days ago she had congestion yesterday morning she woke up and was hoarse  Last night she took some Nyquil  States her throat hurts this morning

## 2020-05-06 NOTE — ED Provider Notes (Signed)
[facility] Provider Note: Taylor Dell, MD, FACEP  CSN: [id number] MRN: [id number] ARRIVAL: 05/06/20 at 0509 ROOM: [facility]   CHIEF COMPLAINT  Sore Throat   HISTORY OF PRESENT ILLNESS  05/06/20 5:21 AM Taylor Stevens is a 29 y.o. female who over the last week has had an itching ear, nasal congestion. Yesterday she was hoarse and her throat felt "clogging and foggy".  She took some NyQuil last night without relief.  This morning her throat is hurting.  She rates the pain is a 5 out of 10, sore in nature, worse with swallowing.  She denies fever, nausea, vomiting, diarrhea, cough or shortness of breath.   Past Medical History:  Diagnosis Date   GERD (gastroesophageal reflux disease)    Guillain Barr syndrome (HCC)     History reviewed. No pertinent surgical history.  Family History  Problem Relation Age of Onset   CAD Mother     Social History   Tobacco Use   Smoking status: Never Smoker   Smokeless tobacco: Never Used  Vaping Use   Vaping Use: Never used  Substance Use Topics   Alcohol use: Yes    Comment: occasionally   Drug use: No    Prior to Admission medications   Medication Sig Start Date End Date Taking? Authorizing Provider  diphenhydrAMINE (BENADRYL) 25 MG tablet Take 1 tablet (25 mg total) by mouth every 6 (six) hours. 03/27/17 09/23/18  Shaune Pollack, MD  docusate sodium (COLACE) 100 MG capsule Take 1 capsule (100 mg total) by mouth every 12 (twelve) hours. 05/15/17   Arby Barrette, MD  ferrous sulfate 325 (65 FE) MG tablet Take 325 mg by mouth daily with breakfast.    [provider]  hydrOXYzine (ATARAX/VISTARIL) 25 MG tablet Take 1 tablet (25 mg total) by mouth at bedtime as needed. 11/08/19   Jeannie Fend, PA-C  hydrOXYzine (ATARAX/VISTARIL) 25 MG tablet Take 25 mg by mouth 3 (three) times daily as needed.    [provider]  VITAMIN D, CHOLECALCIFEROL, PO Take by mouth.    [provider]    omeprazole (PRILOSEC) 20 MG capsule Take 1 capsule (20 mg total) by mouth daily. 09/23/18 05/06/20  Khatri, Hillary Bow, PA-C    Allergies Shellfish allergy   REVIEW OF SYSTEMS  Negative except as noted here or in the History of Present Illness.   PHYSICAL EXAMINATION  Initial Vital Signs Blood pressure 123/88, pulse 94, temperature 98.3 F (36.8 C), temperature source Oral, resp. rate 16, height 5\' 3"  (1.6 m), weight 86.2 kg, SpO2 99 %.  Examination General: Well-developed, well-nourished female in no acute distress; appearance consistent with age of record HENT: normocephalic; atraumatic; mild pharyngeal edema without exudate; uvula midline Eyes: pupils equal, round and reactive to light; extraocular muscles intact Neck: supple Heart: regular rate and rhythm Lungs: clear to auscultation bilaterally Abdomen: soft; nondistended; nontender; bowel sounds present Extremities: No deformity; full range of motion; pulses normal Neurologic: Awake, alert and oriented; motor function intact in all extremities and symmetric; no facial droop Skin: Warm and dry Psychiatric: Normal mood and affect   RESULTS  Summary of this visit's results, reviewed and interpreted by myself:   EKG Interpretation  Date/Time:    Ventricular Rate:    PR Interval:    QRS Duration:   QT Interval:    QTC Calculation:   R Axis:     Text Interpretation:        Laboratory Studies: Results for orders placed or performed  during the hospital encounter of 05/06/20 (from the past 24 hour(s))  SARS Coronavirus 2 by RT PCR (hospital order, performed in [facility] hospital lab) Nasopharyngeal Nasopharyngeal Swab     Status: None   Collection Time: 05/06/20  5:33 AM   Specimen: Nasopharyngeal Swab  Result Value Ref Range   SARS Coronavirus 2 NEGATIVE NEGATIVE  Group A Strep by PCR     Status: None   Collection Time: 05/06/20  5:33 AM   Specimen: Nasopharyngeal Swab; Sterile Swab  Result Value Ref Range   Group A  Strep by PCR NOT DETECTED NOT DETECTED   Imaging Studies: No results found.  ED COURSE and MDM  Nursing notes, initial and subsequent vitals signs, including pulse oximetry, reviewed and interpreted by myself.  Vitals:   05/06/20 0516 05/06/20 0518  BP:  123/88  Pulse:  94  Resp:  16  Temp:  98.3 F (36.8 C)  TempSrc:  Oral  SpO2:  99%  Weight: 86.2 kg   Height: 5\' 3"  (1.6 m)    Medications - No data to display  6:40 AM The patient's sore throat is consistent with a viral illness.  Her strep and COVID-19 are negative.  No antibiotics are indicated at this time.  PROCEDURES  Procedures   ED DIAGNOSES     ICD-10-CM   1. Viral pharyngitis  J02.9   2. COVID-19 virus RNA not detected  Z03.818        Ivory Bail, , MD 05/06/20 [phone]

## 2021-05-27 ENCOUNTER — Emergency Department (HOSPITAL_BASED_OUTPATIENT_CLINIC_OR_DEPARTMENT_OTHER)
Admission: EM | Admit: 2021-05-27 | Discharge: 2021-05-27 | Disposition: A | Discharge status: Home / Self Care | Payer: Medicare Other | Attending: Emergency Medicine | Admitting: Emergency Medicine

## 2021-05-27 ENCOUNTER — Other Ambulatory Visit: Payer: Self-pay

## 2021-05-27 ENCOUNTER — Encounter (HOSPITAL_BASED_OUTPATIENT_CLINIC_OR_DEPARTMENT_OTHER): Payer: Self-pay | Admitting: *Deleted

## 2021-05-27 DIAGNOSIS — Z5321 Procedure and treatment not carried out due to patient leaving prior to being seen by health care provider: Secondary | ICD-10-CM | POA: Insufficient documentation

## 2021-05-27 DIAGNOSIS — R0989 Other specified symptoms and signs involving the circulatory and respiratory systems: Secondary | ICD-10-CM | POA: Insufficient documentation

## 2021-05-27 DIAGNOSIS — X58XXXA Exposure to other specified factors, initial encounter: Secondary | ICD-10-CM | POA: Insufficient documentation

## 2021-05-27 DIAGNOSIS — T375X5A Adverse effect of antiviral drugs, initial encounter: Secondary | ICD-10-CM | POA: Insufficient documentation

## 2021-05-27 NOTE — ED Triage Notes (Addendum)
Pt c/o allergic reaction after taking covid antiviral med x 30 mins ago , " feeling of fullness in throat", no resp distress noted in triage

## 2021-08-22 ENCOUNTER — Emergency Department (HOSPITAL_COMMUNITY): Payer: Self-pay

## 2021-08-22 ENCOUNTER — Emergency Department (HOSPITAL_COMMUNITY)
Admission: EM | Admit: 2021-08-22 | Discharge: 2021-08-22 | Disposition: A | Discharge status: Home / Self Care | Payer: Self-pay | Attending: Emergency Medicine | Admitting: Emergency Medicine

## 2021-08-22 DIAGNOSIS — G8929 Other chronic pain: Secondary | ICD-10-CM | POA: Insufficient documentation

## 2021-08-22 DIAGNOSIS — M79672 Pain in left foot: Secondary | ICD-10-CM | POA: Insufficient documentation

## 2021-08-22 DIAGNOSIS — M79671 Pain in right foot: Secondary | ICD-10-CM | POA: Insufficient documentation

## 2021-08-22 LAB — CBC WITH DIFFERENTIAL/PLATELET
Abs Immature Granulocytes: 0.01 10*3/uL (ref 0.00–0.07)
Basophils Absolute: 0 10*3/uL (ref 0.0–0.1)
Basophils Relative: 0 %
Eosinophils Absolute: 0.1 10*3/uL (ref 0.0–0.5)
Eosinophils Relative: 1 %
HCT: 38 % (ref 36.0–46.0)
Hemoglobin: 12.7 g/dL (ref 12.0–15.0)
Immature Granulocytes: 0 %
Lymphocytes Relative: 31 %
Lymphs Abs: 2.1 10*3/uL (ref 0.7–4.0)
MCH: 32.4 pg (ref 26.0–34.0)
MCHC: 33.4 g/dL (ref 30.0–36.0)
MCV: 96.9 fL (ref 80.0–100.0)
Monocytes Absolute: 0.5 10*3/uL (ref 0.1–1.0)
Monocytes Relative: 8 %
Neutro Abs: 4.2 10*3/uL (ref 1.7–7.7)
Neutrophils Relative %: 60 %
Platelets: 254 10*3/uL (ref 150–400)
RBC: 3.92 MIL/uL (ref 3.87–5.11)
RDW: 11.9 % (ref 11.5–15.5)
WBC: 6.9 10*3/uL (ref 4.0–10.5)
nRBC: 0 % (ref 0.0–0.2)

## 2021-08-22 LAB — BASIC METABOLIC PANEL
Anion gap: 6 (ref 5–15)
BUN: 15 mg/dL (ref 6–20)
CO2: 25 mmol/L (ref 22–32)
Calcium: 8.6 mg/dL — ABNORMAL LOW (ref 8.9–10.3)
Chloride: 103 mmol/L (ref 98–111)
Creatinine, Ser: 0.71 mg/dL (ref 0.44–1.00)
GFR, Estimated: >60 mL/min (ref >60)
Glucose, Bld: 96 mg/dL (ref 70–99)
Potassium: 4 mmol/L (ref 3.5–5.1)
Sodium: 134 mmol/L — ABNORMAL LOW (ref 135–145)

## 2021-08-22 NOTE — ED Provider Notes (Signed)
[facility]-EMERGENCY DEPT Provider Note   CSN: [id number] Arrival date & time: 08/22/21  4098     History Chief Complaint  Patient presents with   Foot Pain    Taylor Stevens is a 30 y.o. female with a past medical history significant for Guillain-Barr syndrome who presents with 3 months of worsening bilateral foot pain.  Patient reports feeling of a knot at the bottom of her foot that she is stepping on, as well as some sharp, tingling pain, throbbing sensation after walking on feet.  Patient denies any injury, fall.  Patient denies history of diabetes.  Patient denies any numbness.  Patient denies any difficulty walking other than pain.  Patient has not tried anything for the pain at this time other than Tylenol.  Patient reports pain is 10/10.  Additionally patient reports that she has had some facial twitching, spasms over the last few months.  Patient does not think that is related, but does have some concern.   Foot Pain      Past Medical History:  Diagnosis Date   GERD (gastroesophageal reflux disease)    Guillain Barr syndrome (HCC)     There are no problems to display for this patient.   No past surgical history on file.   OB History   No obstetric history on file.     Family History  Problem Relation Age of Onset   CAD Mother     Social History   Tobacco Use   Smoking status: Never   Smokeless tobacco: Never  Vaping Use   Vaping Use: Never used  Substance Use Topics   Alcohol use: Yes    Comment: occasionally   Drug use: No    Home Medications Prior to Admission medications   Medication Sig Start Date End Date Taking? Authorizing Provider  Multiple Vitamins-Minerals (MULTIVITAMIN WITH MINERALS) tablet Take 1 tablet by mouth daily.   Yes [provider]  diphenhydrAMINE (BENADRYL) 25 MG tablet Take 1 tablet (25 mg total) by mouth every 6 (six) hours. 03/27/17 09/23/18  Shaune Pollack, MD  docusate sodium (COLACE)  100 MG capsule Take 1 capsule (100 mg total) by mouth every 12 (twelve) hours. Patient not taking: Reported on 08/22/2021 05/15/17   Arby Barrette, MD  ferrous sulfate 325 (65 FE) MG tablet Take 325 mg by mouth daily with breakfast. Patient not taking: Reported on 08/22/2021    [provider]  hydrOXYzine (ATARAX/VISTARIL) 25 MG tablet Take 1 tablet (25 mg total) by mouth at bedtime as needed. Patient not taking: Reported on 08/22/2021 11/08/19   Army Melia A, PA-C  hydrOXYzine (ATARAX/VISTARIL) 25 MG tablet Take 25 mg by mouth 3 (three) times daily as needed. Patient not taking: Reported on 08/22/2021    [provider]  VITAMIN D, CHOLECALCIFEROL, PO Take by mouth. Patient not taking: Reported on 08/22/2021    [provider]  omeprazole (PRILOSEC) 20 MG capsule Take 1 capsule (20 mg total) by mouth daily. 09/23/18 05/06/20  Dietrich Pates, PA-C    Allergies    Shellfish allergy  Review of Systems   Review of Systems  Musculoskeletal:  Positive for arthralgias.  All other systems reviewed and are negative.  Physical Exam Updated Vital Signs BP 128/75   Pulse 86   Temp 98.7 F (37.1 C) (Oral)   Resp 14   Ht 5\' 3"  (1.6 m)   Wt 86.2 kg   SpO2 98%   BMI 33.66 kg/m   Physical Exam  Vitals and nursing note reviewed.  Constitutional:      General: She is not in acute distress.    Appearance: Normal appearance.  HENT:     Head: Normocephalic and atraumatic.     Comments: No evidence of facial twitching, asymmetry, fasciculations on my exam.  Negative Chvostek sign. Eyes:     General:        Right eye: No discharge.        Left eye: No discharge.  Cardiovascular:     Rate and Rhythm: Normal rate and regular rhythm.     Comments: Intact DP, PT pulses bilaterally Pulmonary:     Effort: Pulmonary effort is normal. No respiratory distress.  Musculoskeletal:        General: No deformity.     Comments: Intact strength bilateral plantar, dorsiflexion,  inversion, eversion of the ankle.  Minimal tenderness to palpation, no step-off or deformity noted on physical exam.  Skin:    General: Skin is warm and dry.  Neurological:     Mental Status: She is alert and oriented to person, place, and time.  Psychiatric:        Mood and Affect: Mood normal.        Behavior: Behavior normal.    ED Results / Procedures / Treatments   Labs (all labs ordered are listed, but only abnormal results are displayed) Labs Reviewed  BASIC METABOLIC PANEL - Abnormal; Notable for the following components:      Result Value   Sodium 134 (*)    Calcium 8.6 (*)    All other components within normal limits  CBC WITH DIFFERENTIAL/PLATELET    EKG None  Radiology DG Foot Complete Left  Result Date: 08/22/2021 CLINICAL DATA:  Worsening bilateral foot numbness and tingling 3 months. Heel pain. EXAM: LEFT FOOT - COMPLETE 3+ VIEW COMPARISON:  None. FINDINGS: There is no evidence of fracture or dislocation. There is no evidence of arthropathy or other focal bone abnormality. Soft tissues are unremarkable. IMPRESSION: Negative. Electronically Signed   By: Elberta Fortis M.D.   On: 08/22/2021 09:42   DG Foot Complete Right  Result Date: 08/22/2021 CLINICAL DATA:  Worsening bilateral foot numbness and tingling 3 months. Heel pain. EXAM: RIGHT FOOT COMPLETE - 3+ VIEW COMPARISON:  None. FINDINGS: There is no evidence of fracture or dislocation. There is no evidence of arthropathy or other focal bone abnormality. Soft tissues are unremarkable. IMPRESSION: Negative. Electronically Signed   By: Elberta Fortis M.D.   On: 08/22/2021 09:42    Procedures Procedures   Medications Ordered in ED Medications - No data to display  ED Course  I have reviewed the triage vital signs and the nursing notes.  Pertinent labs & imaging results that were available during my care of the patient were reviewed by me and considered in my medical decision making (see chart for details).     MDM Rules/Calculators/A&P                         Overall well-appearing female with bilateral chronic foot pain.  No evidence of arthritic or other traumatic changes on radiographic imaging.  No step-off or deformity on physical exam.  Patient without significant neurologic finding to suggest radiculopathy.  Patient without history of diabetes, normal blood sugar on lab work today.  Minimally decreased calcium, however doubt that this would cause patient's symptoms.  Discussed pain control with ibuprofen, Tylenol, suggested follow-up with orthopedics.  Encouraged rest, elevation,  ice to the affected areas as needed for pain.  Patient understands and agrees to plan.  Return precautions given.  Discharged in stable condition. Final Clinical Impression(s) / ED Diagnoses Final diagnoses:  Foot pain, left  Foot pain, right    Rx / DC Orders ED Discharge Orders     None        [place] 08/22/21 1006    Pricilla Loveless, MD 08/23/21 1521

## 2021-08-22 NOTE — ED Triage Notes (Addendum)
Patient reports feet have been numb and hurting. Says when she walks it feels like something is in her foot. Patient says pain feels like pin and needles with sharp tightness. Patient says it has been going on 3 months, getting progressively worse. Patient says she also has a lot of jerking in her face, her nose and eyes twitch. Pain from feet rated 10/10

## 2021-08-22 NOTE — Discharge Instructions (Signed)
Please use Tylenol or ibuprofen for pain.  You may use 600 mg ibuprofen every 6 hours or 1000 mg of Tylenol every 6 hours.  You may choose to alternate between the 2.  This would be most effective.  Not to exceed 4 g of Tylenol within 24 hours.  Not to exceed 3200 mg ibuprofen 24 hours.  I recommend rest, ice, compression with ankle brace as needed, trying some ergonomic shoe choices.

## 2021-10-20 ENCOUNTER — Encounter (HOSPITAL_COMMUNITY): Payer: Self-pay

## 2021-10-20 ENCOUNTER — Emergency Department (HOSPITAL_COMMUNITY)
Admission: EM | Admit: 2021-10-20 | Discharge: 2021-10-21 | Disposition: A | Discharge status: Home / Self Care | Payer: BC Managed Care – PPO | Attending: Emergency Medicine | Admitting: Emergency Medicine

## 2021-10-20 ENCOUNTER — Emergency Department (HOSPITAL_COMMUNITY): Payer: BC Managed Care – PPO

## 2021-10-20 DIAGNOSIS — R0989 Other specified symptoms and signs involving the circulatory and respiratory systems: Secondary | ICD-10-CM | POA: Diagnosis present

## 2021-10-20 DIAGNOSIS — R059 Cough, unspecified: Secondary | ICD-10-CM | POA: Diagnosis not present

## 2021-10-20 DIAGNOSIS — E041 Nontoxic single thyroid nodule: Secondary | ICD-10-CM

## 2021-10-20 DIAGNOSIS — R09A2 Foreign body sensation, throat: Secondary | ICD-10-CM

## 2021-10-20 LAB — CBC WITH DIFFERENTIAL/PLATELET
Abs Immature Granulocytes: 0.02 10*3/uL (ref 0.00–0.07)
Basophils Absolute: 0 10*3/uL (ref 0.0–0.1)
Basophils Relative: 0 %
Eosinophils Absolute: 0.1 10*3/uL (ref 0.0–0.5)
Eosinophils Relative: 1 %
HCT: 40.7 % (ref 36.0–46.0)
Hemoglobin: 13.8 g/dL (ref 12.0–15.0)
Immature Granulocytes: 0 %
Lymphocytes Relative: 36 %
Lymphs Abs: 3 10*3/uL (ref 0.7–4.0)
MCH: 32.8 pg (ref 26.0–34.0)
MCHC: 33.9 g/dL (ref 30.0–36.0)
MCV: 96.7 fL (ref 80.0–100.0)
Monocytes Absolute: 0.5 10*3/uL (ref 0.1–1.0)
Monocytes Relative: 6 %
Neutro Abs: 4.6 10*3/uL (ref 1.7–7.7)
Neutrophils Relative %: 57 %
Platelets: 279 10*3/uL (ref 150–400)
RBC: 4.21 MIL/uL (ref 3.87–5.11)
RDW: 12 % (ref 11.5–15.5)
WBC: 8.2 10*3/uL (ref 4.0–10.5)
nRBC: 0 % (ref 0.0–0.2)

## 2021-10-20 LAB — BASIC METABOLIC PANEL
Anion gap: 4 — ABNORMAL LOW (ref 5–15)
BUN: 17 mg/dL (ref 6–20)
CO2: 24 mmol/L (ref 22–32)
Calcium: 9.1 mg/dL (ref 8.9–10.3)
Chloride: 108 mmol/L (ref 98–111)
Creatinine, Ser: 0.68 mg/dL (ref 0.44–1.00)
GFR, Estimated: >60 mL/min (ref >60)
Glucose, Bld: 100 mg/dL — ABNORMAL HIGH (ref 70–99)
Potassium: 3.7 mmol/L (ref 3.5–5.1)
Sodium: 136 mmol/L (ref 135–145)

## 2021-10-20 LAB — I-STAT BETA HCG BLOOD, ED (MC, WL, AP ONLY): I-stat hCG, quantitative: <5 m[IU]/mL (ref <5)

## 2021-10-20 MED ORDER — IOHEXOL 300 MG/ML  SOLN
75.0000 mL | Freq: Once | INTRAMUSCULAR | Status: AC | PRN
  Administered 2021-10-20: 75 mL via INTRAVENOUS

## 2021-10-20 NOTE — ED Provider Triage Note (Signed)
Emergency Medicine Provider Triage Evaluation Note  Taylor Stevens , a 31 y.o. female  was evaluated in triage.  Pt complains of sensation of something getting stuck in her throat and feeling a mass or nodule over the front of her throat.  Symptoms have been present for about 2 weeks persistently although she notes that over the past year she has noticed similar feelings intermittently.  She reports she has more persistently had an odd sensation like food is getting stuck when she tries to swallow but then it goes down on its own.  She has not noted any facial swelling or swelling of the lips or tongue.  No difficulty breathing.  Review of Systems  Positive: Dysphagia, neck mass Negative: Fevers, facial swelling, vomiting  Physical Exam  BP 126/85 (BP Location: Left Arm)    Pulse 94    Temp 98.1 F (36.7 C) (Oral)    Resp 16    Ht 5\' 3"  (1.6 m)    Wt 86.2 kg    SpO2 100%    BMI 33.66 kg/m  Gen:   Awake, no distress   Resp:  Normal effort  MSK:   Moves extremities without difficulty  Other:  Likely a palpable small thyroid nodule and thyroid feels slightly enlarged  Medical Decision Making  Medically screening exam initiated at 10:01 PM.  Appropriate orders placed.  was informed that the remainder of the evaluation will be completed by another provider, this initial triage assessment does not replace that evaluation, and the importance of remaining in the ED until their evaluation is complete.  Sensation of something struck in patient's throat and dysphagia.  Patient may have a palpable thyroid nodule versus some other sort of soft tissue mass.  We will get CT soft tissue neck and lab work   Taylor Stevens, Taylor Stevens 10/20/21 2207

## 2021-10-20 NOTE — ED Triage Notes (Signed)
Patient from home with report of throat swelling and pressure that began x 2 weeks ago. Pt reports feeling as though something is stuck in her throat and reports difficulty swallowing. Denies pain.

## 2021-10-21 NOTE — ED Provider Notes (Signed)
[facility] DEPT Provider Note  CSN: [id number] Arrival date & time: 10/20/21 2130  Chief Complaint(s) Oral Swelling  HPI Taylor Stevens is a 31 y.o. female who presents to the emergency department with several years of intermittent bolus sensation with swallowing that has become more frequent in the past several weeks.  More noticeable when drinking liquids, but not always.  No recent fevers or infections.  Patient endorses dry cough.  No nausea vomiting.  No chest pain or shortness of breath.  No abdominal.  HPI  Past Medical History Past Medical History:  Diagnosis Date   GERD (gastroesophageal reflux disease)    Guillain Barr syndrome ([place])    There are no problems to display for this patient.  Home Medication(s) Prior to Admission medications   Medication Sig Start Date End Date Taking? Authorizing Provider  diphenhydrAMINE (BENADRYL) 25 MG tablet Take 1 tablet (25 mg total) by mouth every 6 (six) hours. 03/27/17 09/23/18  Duffy Bruce, MD  docusate sodium (COLACE) 100 MG capsule Take 1 capsule (100 mg total) by mouth every 12 (twelve) hours. Patient not taking: Reported on 08/22/2021 05/15/17   Charlesetta Shanks, MD  ferrous sulfate 325 (65 FE) MG tablet Take 325 mg by mouth daily with breakfast. Patient not taking: Reported on 08/22/2021    [provider]  hydrOXYzine (ATARAX/VISTARIL) 25 MG tablet Take 1 tablet (25 mg total) by mouth at bedtime as needed. Patient not taking: Reported on 08/22/2021 11/08/19   Suella Broad A, PA-C  hydrOXYzine (ATARAX/VISTARIL) 25 MG tablet Take 25 mg by mouth 3 (three) times daily as needed. Patient not taking: Reported on 08/22/2021    [provider]  Multiple Vitamins-Minerals (MULTIVITAMIN WITH MINERALS) tablet Take 1 tablet by mouth daily.    [provider]  VITAMIN D, CHOLECALCIFEROL, PO Take by mouth. Patient not taking: Reported on 08/22/2021    [provider]   omeprazole (PRILOSEC) 20 MG capsule Take 1 capsule (20 mg total) by mouth daily. 09/23/18 05/06/20  Delia Heady, PA-C                                                                                                                                    Allergies Shellfish allergy  Review of Systems Review of Systems As noted in HPI  Physical Exam Vital Signs  I have reviewed the triage vital signs BP 122/84 (BP Location: Right Arm)    Pulse 92    Temp 98 F (36.7 C) (Oral)    Resp 17    Ht 5\' 3"  (1.6 m)    Wt 86.2 kg    SpO2 100%    BMI 33.66 kg/m   Physical Exam Vitals reviewed.  Constitutional:      General: She is not in acute distress.    Appearance: She is well-developed. She is not diaphoretic.  HENT:     Head: Normocephalic and atraumatic.  Right Ear: External ear normal.     Left Ear: External ear normal.     Nose: Nose normal.     Mouth/Throat:     Mouth: No angioedema.     Palate: No lesions.     Pharynx: No uvula swelling.     Tonsils: No tonsillar exudate or tonsillar abscesses.  Eyes:     General: No scleral icterus.    Conjunctiva/sclera: Conjunctivae normal.  Neck:     Thyroid: No thyromegaly or thyroid tenderness.     Trachea: Phonation normal.  Cardiovascular:     Rate and Rhythm: Normal rate and regular rhythm.  Pulmonary:     Effort: Pulmonary effort is normal. No respiratory distress.     Breath sounds: No stridor.  Abdominal:     General: There is no distension.  Musculoskeletal:        General: Normal range of motion.     Cervical back: Normal range of motion.  Lymphadenopathy:     Cervical: No cervical adenopathy.  Neurological:     Mental Status: She is alert and oriented to person, place, and time.  Psychiatric:        Behavior: Behavior normal.    ED Results and Treatments Labs (all labs ordered are listed, but only abnormal results are displayed) Labs Reviewed  BASIC METABOLIC PANEL - Abnormal; Notable for the following components:       Result Value   Glucose, Bld 100 (*)    Anion gap 4 (*)    All other components within normal limits  CBC WITH DIFFERENTIAL/PLATELET  I-STAT BETA HCG BLOOD, ED ([facility], [facility], [facility] ONLY)                                                                                                                         EKG  EKG Interpretation  Date/Time:    Ventricular Rate:    PR Interval:    QRS Duration:   QT Interval:    QTC Calculation:   R Axis:     Text Interpretation:         Radiology CT Soft Tissue Neck W Contrast  Result Date: 10/21/2021 CLINICAL DATA:  Initial evaluation for sensation of something stuck in throat. EXAM: CT NECK WITH CONTRAST TECHNIQUE: Multidetector CT imaging of the neck was performed using the standard protocol following the bolus administration of intravenous contrast. RADIATION DOSE REDUCTION: This exam was performed according to the departmental dose-optimization program which includes automated exposure control, adjustment of the mA and/or kV according to patient size and/or use of iterative reconstruction technique. CONTRAST:  85mL OMNIPAQUE IOHEXOL 300 MG/ML  SOLN COMPARISON:  None available. FINDINGS: Pharynx and larynx: Oral cavity within normal limits. No acute inflammatory changes seen about the dentition. Oropharynx and nasopharynx within normal limits. No retropharyngeal collection. Epiglottis normal. Lingual tonsils partially fill the vallecula. Hypopharynx and supraglottic larynx within normal limits. Glottis normal. Subglottic airway patent clear. Visualized upper esophagus within normal limits. No visible radiopaque  foreign body. Salivary glands: Salivary glands including the parotid and submandibular glands are within normal limits. Thyroid: 1 cm calcified nodule present within the left lobe of thyroid, indeterminate. Thyroid otherwise unremarkable. Lymph nodes: No enlarged or pathologic adenopathy within the neck. Vascular: Normal intravascular enhancement seen  throughout the neck. Limited intracranial: Unremarkable. Visualized orbits: Unremarkable. Mastoids and visualized paranasal sinuses: Visualized paranasal sinuses are clear. Visualized mastoids and middle ear cavities are well pneumatized and free of fluid. Skeleton: No discrete or worrisome osseous lesions. Mild-to-moderate spondylosis present at C5-6 and C6-7. Upper chest: Visualized upper chest demonstrates no acute finding. Other: None. IMPRESSION: 1. Negative CT of the neck. No acute abnormality or radiopaque foreign body. 2. Mild-to-moderate spondylosis at C5-6 and C6-7. 3. 1 cm calcified left thyroid nodule, indeterminate. Further evaluation with dedicated thyroid ultrasound recommended. This could be performed on a nonemergent outpatient basis. (Ref: J Am Coll Radiol. 2015 Feb;12(2): 143-50). Electronically Signed   By: Jeannine Boga M.D.   On: 10/21/2021 00:18    Pertinent labs & imaging results that were available during my care of the patient were reviewed by me and considered in my medical decision making (see MDM for details).  Medications Ordered in ED Medications  iohexol (OMNIPAQUE) 300 MG/ML solution 75 mL (75 mLs Intravenous Contrast Given 10/20/21 2311)                                                                                                                                     Procedures Procedures  (including critical care time)  Medical Decision Making / ED Course        Bolus sensation Patient seen in the MSE process and labs and imaging obtained  Work-up ordered to assess concerns above.  Labs and imaging independently interpreted by me and noted below: CBC without leukocytosis or anemia No significant electrolyte derangements or renal sufficiency CT without evidence of soft tissue abscess.  Did note small thyroid nodule.  Management: P.o. challenge  Reassessment: Tolerating p.o. intake Now asymptomatic.        Assessment/Plan:                                                                                                                                               Bolus sensation. No convincing etiology  at this time but patient is noted to have an incidental thyroid nodule. Recommend she follow-up for ultrasound evaluation.  Final Clinical Impression(s) / ED Diagnoses Final diagnoses:  Globus pharyngeus  Thyroid nodule   The patient appears reasonably screened and/or stabilized for discharge and I doubt any other medical condition or other [facility] requiring further screening, evaluation, or treatment in the ED at this time prior to discharge. Safe for discharge with strict return precautions.  Disposition: Discharge  Condition: Good  I have discussed the results, Dx and Tx plan with the patient/family who expressed understanding and agree(s) with the plan. Discharge instructions discussed at length. The patient/family was given strict return precautions who verbalized understanding of the instructions. No further questions at time of discharge.    ED Discharge Orders     None        Follow Up: Primary care provider  Call  to schedule an appointment for close follow up           This chart was dictated using voice recognition software.  Despite best efforts to proofread,  errors can occur which can change the documentation meaning.    Fatima Blank, MD 10/21/21 [phone]

## 2021-10-21 NOTE — Discharge Instructions (Signed)
During the workup we noted incidental findings on your imaging that would require you to follow-up with your regular doctor for further evaluation/management:  1 cm calcified left thyroid nodule, indeterminate. Further  evaluation with dedicated thyroid ultrasound recommended. This could  be performed on a nonemergent outpatient basis. (Ref: J Am Coll  Radiol. 2015 Feb;12(2): 143-50).

## 2021-10-31 ENCOUNTER — Encounter (HOSPITAL_COMMUNITY): Payer: Self-pay

## 2021-10-31 ENCOUNTER — Other Ambulatory Visit: Payer: Self-pay

## 2021-10-31 ENCOUNTER — Emergency Department (HOSPITAL_COMMUNITY): Payer: BC Managed Care – PPO

## 2021-10-31 ENCOUNTER — Emergency Department (HOSPITAL_COMMUNITY)
Admission: EM | Admit: 2021-10-31 | Discharge: 2021-10-31 | Disposition: A | Discharge status: Home / Self Care | Payer: BC Managed Care – PPO | Attending: Emergency Medicine | Admitting: Emergency Medicine

## 2021-10-31 DIAGNOSIS — W25XXXA Contact with sharp glass, initial encounter: Secondary | ICD-10-CM | POA: Diagnosis not present

## 2021-10-31 DIAGNOSIS — S61411A Laceration without foreign body of right hand, initial encounter: Secondary | ICD-10-CM | POA: Diagnosis present

## 2021-10-31 MED ORDER — LIDOCAINE-EPINEPHRINE (PF) 2 %-1:200000 IJ SOLN
10.0000 mL | Freq: Once | INTRAMUSCULAR | Status: AC
  Administered 2021-10-31: 10 mL
  Filled 2021-10-31: qty 20

## 2021-10-31 NOTE — ED Triage Notes (Signed)
Pt reports she accidentally punch a mirror with her right hand. Pt has lac to posterior right hand. Bleeding somewhat controlled at this time.

## 2021-10-31 NOTE — ED Provider Notes (Signed)
[facility]-EMERGENCY DEPT Provider Note   CSN: [id number] Arrival date & time: 10/31/21  1240     History  Chief Complaint  Patient presents with   Laceration    Taylor Stevens is a 31 y.o. female.  31 year old female with prior medical history as detailed below presents for evaluation.  Patient reports that she cut her right hand on broken glass 1 hour prior to arrival.  She accidentally struck the mirror with her right hand and it broke and cut her hand.  She denies other injury.  Bleeding is controlled.  She denies self harm intent.  She is unsure of her last tetanus.  She declines tetanus today.  She has a history of Guillain-Barr.  She is concerned that tetanus could be associated with Guillain-Barr.  The history is provided by the patient and medical records.  Laceration Location:  Hand Hand laceration location:  R hand Depth:  Cutaneous Quality: jagged   Bleeding: venous   Time since incident:  1 hour Laceration mechanism:  Broken glass Pain details:    Quality:  Aching   Severity:  No pain   Progression:  Unchanged     Home Medications Prior to Admission medications   Medication Sig Start Date End Date Taking? Authorizing Provider  diphenhydrAMINE (BENADRYL) 25 MG tablet Take 1 tablet (25 mg total) by mouth every 6 (six) hours. 03/27/17 09/23/18  Shaune Pollack, MD  docusate sodium (COLACE) 100 MG capsule Take 1 capsule (100 mg total) by mouth every 12 (twelve) hours. Patient not taking: Reported on 08/22/2021 05/15/17   Arby Barrette, MD  ferrous sulfate 325 (65 FE) MG tablet Take 325 mg by mouth daily with breakfast. Patient not taking: Reported on 08/22/2021    [provider]  hydrOXYzine (ATARAX/VISTARIL) 25 MG tablet Take 1 tablet (25 mg total) by mouth at bedtime as needed. Patient not taking: Reported on 08/22/2021 11/08/19   Army Melia A, PA-C  hydrOXYzine (ATARAX/VISTARIL) 25 MG tablet Take 25 mg by mouth 3 (three)  times daily as needed. Patient not taking: Reported on 08/22/2021    [provider]  Multiple Vitamins-Minerals (MULTIVITAMIN WITH MINERALS) tablet Take 1 tablet by mouth daily.    [provider]  VITAMIN D, CHOLECALCIFEROL, PO Take by mouth. Patient not taking: Reported on 08/22/2021    [provider]  omeprazole (PRILOSEC) 20 MG capsule Take 1 capsule (20 mg total) by mouth daily. 09/23/18 05/06/20  Dietrich Pates, PA-C      Allergies    Shellfish allergy    Review of Systems   Review of Systems  All other systems reviewed and are negative.  Physical Exam Updated Vital Signs BP 130/89 (BP Location: Left Arm)    Pulse 86    Temp 98.4 F (36.9 C) (Oral)    Resp 16    Ht 5\' 3"  (1.6 m)    Wt 86.2 kg    SpO2 100%    BMI 33.66 kg/m  Physical Exam Vitals and nursing note reviewed.  Constitutional:      General: She is not in acute distress.    Appearance: Normal appearance. She is well-developed.  HENT:     Head: Normocephalic and atraumatic.  Eyes:     Extraocular Movements: Extraocular movements intact.     Conjunctiva/sclera: Conjunctivae normal.     Pupils: Pupils are equal, round, and reactive to light.  Cardiovascular:     Heart sounds: Normal heart sounds.  Pulmonary:  Effort: Pulmonary effort is normal. No respiratory distress.  Abdominal:     General: There is no distension.     Palpations: Abdomen is soft.     Tenderness: There is no abdominal tenderness.  Musculoskeletal:        General: No deformity. Normal range of motion.     Cervical back: Normal range of motion and neck supple.  Skin:    General: Skin is warm and dry.     Comments: Superficial laceration noted to the dorsum of the right hand just proximal to the right 5th MCP joint.  Laceration is jagged and irregular shaped.  Total length is approximately 1 cm.  Neurological:     General: No focal deficit present.     Mental Status: She is alert and oriented to person, place, and  time.    ED Results / Procedures / Treatments   Labs (all labs ordered are listed, but only abnormal results are displayed) Labs Reviewed - No data to display  EKG None  Radiology No results found.  Procedures .Marland Kitchen.Laceration Repair  Date/Time: 10/31/2021 2:51 PM Performed by: Wynetta FinesMessick, Judea Fennimore C, MD Authorized by: Wynetta FinesMessick, Amantha Sklar C, MD   Consent:    Consent obtained:  Verbal   Consent given by:  Patient   Risks, benefits, and alternatives were discussed: yes     Risks discussed:  Infection, need for additional repair, nerve damage, poor wound healing, poor cosmetic result, pain, retained foreign body, vascular damage and tendon damage Universal protocol:    Immediately prior to procedure, a time out was called: yes     Patient identity confirmed:  Verbally with patient Anesthesia:    Anesthesia method:  Local infiltration Laceration details:    Location:  Hand   Hand location:  R hand, dorsum   Length (cm):  1 Pre-procedure details:    Preparation:  Imaging obtained to evaluate for foreign bodies Exploration:    Limited defect created (wound extended): no     Hemostasis achieved with:  Direct pressure   Imaging outcome: foreign body not noted     Wound exploration: wound explored through full range of motion and entire depth of wound visualized     Contaminated: no   Treatment:    Area cleansed with:  Saline and povidone-iodine   Amount of cleaning:  Standard   Debridement:  None Skin repair:    Repair method:  Sutures   Suture size:  4-0   Wound skin closure material used: vicryl.   Suture technique:  Simple interrupted   Number of sutures:  6 Approximation:    Approximation:  Close Repair type:    Repair type:  Simple Post-procedure details:    Dressing:  Bulky dressing   Procedure completion:  Tolerated    Medications Ordered in ED Medications  lidocaine-EPINEPHrine (XYLOCAINE W/EPI) 2 %-1:200000 (PF) injection 10 mL (has no administration in time range)     ED Course/ Medical Decision Making/ A&P                           Medical Decision Making Amount and/or Complexity of Data Reviewed Radiology: ordered.  Risk Prescription drug management.    Medical Screen Complete  This patient presented to the ED with complaint of hand laceration.  This complaint involves an extensive number of treatment options. The initial differential diagnosis includes, but is not limited to, skin lac, tendon lac, fracture, etc.   This presentation is: Acute, Previously  Undiagnosed, and Uncertain Prognosis  Patient presents with complaint of right hand laceration.  She cut her hand on broken glass.  Imaging obtained shows no evidence of fracture or other retained foreign body.  Wound explored.  No evidence of foreign body found  Wound closed without difficulty.  Patient tolerated procedure well.  Patient does understand need for outpatient follow-up.  Strict return precautions given and understood.  Co morbidities that complicated the patient's evaluation  Hx of GBS   Additional history obtained:  External records from outside sources obtained and reviewed including prior ED visits and prior Inpatient records.      Imaging Studies ordered:  I ordered imaging studies including right hand  I independently visualized and interpreted obtained imaging which showed NAD I agree with the radiologist interpretation.    Medicines ordered:  I ordered medication including lidocaine  for anesthesia  Reevaluation of the patient after these medicines showed that the patient: improved      Problem List / ED Course:  Right hand laceration   Reevaluation:  After the interventions noted above, I reevaluated the patient and found that they have: improved   Disposition:  After consideration of the diagnostic results and the patients response to treatment, I feel that the patent would benefit from close outpatient follow up.             Final Clinical Impression(s) / ED Diagnoses Final diagnoses:  Laceration of right hand without foreign body, initial encounter    Rx / DC Orders ED Discharge Orders     None         Wynetta Fines, MD 10/31/21 1453

## 2021-10-31 NOTE — Discharge Instructions (Signed)
Return for any problem.   Sutures placed today will absorb on their own.  There is no need to return for removal of the sutures.  Keep dressing in place for 24 hours.  Thereafter clean the wound gently with soap and water.  The wound should be covered with a Band-Aid or other sterile dressing for the next week.

## 2021-11-16 ENCOUNTER — Ambulatory Visit: Payer: BC Managed Care – PPO | Admitting: Family Medicine

## 2021-11-16 ENCOUNTER — Encounter (HOSPITAL_COMMUNITY): Payer: Self-pay | Admitting: Radiology

## 2022-01-07 ENCOUNTER — Emergency Department (HOSPITAL_COMMUNITY)
Admission: EM | Admit: 2022-01-07 | Discharge: 2022-01-07 | Disposition: A | Discharge status: Home / Self Care | Payer: BC Managed Care – PPO | Attending: Emergency Medicine | Admitting: Emergency Medicine

## 2022-01-07 ENCOUNTER — Encounter (HOSPITAL_COMMUNITY): Payer: Self-pay | Admitting: Emergency Medicine

## 2022-01-07 ENCOUNTER — Emergency Department (HOSPITAL_COMMUNITY): Payer: BC Managed Care – PPO

## 2022-01-07 DIAGNOSIS — R197 Diarrhea, unspecified: Secondary | ICD-10-CM | POA: Diagnosis not present

## 2022-01-07 DIAGNOSIS — Z20822 Contact with and (suspected) exposure to covid-19: Secondary | ICD-10-CM | POA: Insufficient documentation

## 2022-01-07 DIAGNOSIS — R0602 Shortness of breath: Secondary | ICD-10-CM

## 2022-01-07 LAB — BASIC METABOLIC PANEL
Anion gap: 7 (ref 5–15)
BUN: 14 mg/dL (ref 6–20)
CO2: 23 mmol/L (ref 22–32)
Calcium: 8.7 mg/dL — ABNORMAL LOW (ref 8.9–10.3)
Chloride: 106 mmol/L (ref 98–111)
Creatinine, Ser: 0.89 mg/dL (ref 0.44–1.00)
GFR, Estimated: >60 mL/min (ref >60)
Glucose, Bld: 98 mg/dL (ref 70–99)
Potassium: 3.4 mmol/L — ABNORMAL LOW (ref 3.5–5.1)
Sodium: 136 mmol/L (ref 135–145)

## 2022-01-07 LAB — I-STAT BETA HCG BLOOD, ED (MC, WL, AP ONLY): I-stat hCG, quantitative: <5 m[IU]/mL (ref <5)

## 2022-01-07 LAB — RESP PANEL BY RT-PCR (FLU A&B, COVID) ARPGX2
Influenza A by PCR: NEGATIVE
Influenza B by PCR: NEGATIVE
SARS Coronavirus 2 by RT PCR: NEGATIVE

## 2022-01-07 LAB — URINALYSIS, ROUTINE W REFLEX MICROSCOPIC
Bilirubin Urine: NEGATIVE
Glucose, UA: NEGATIVE mg/dL
Ketones, ur: 20 mg/dL — AB
Leukocytes,Ua: NEGATIVE
Nitrite: NEGATIVE
Protein, ur: NEGATIVE mg/dL
Specific Gravity, Urine: 1.01 (ref 1.005–1.030)
pH: 5 (ref 5.0–8.0)

## 2022-01-07 LAB — CBC
HCT: 39.1 % (ref 36.0–46.0)
Hemoglobin: 12.8 g/dL (ref 12.0–15.0)
MCH: 32.7 pg (ref 26.0–34.0)
MCHC: 32.7 g/dL (ref 30.0–36.0)
MCV: 99.7 fL (ref 80.0–100.0)
Platelets: 236 10*3/uL (ref 150–400)
RBC: 3.92 MIL/uL (ref 3.87–5.11)
RDW: 11.7 % (ref 11.5–15.5)
WBC: 5.8 10*3/uL (ref 4.0–10.5)
nRBC: 0 % (ref 0.0–0.2)

## 2022-01-07 MED ORDER — ALBUTEROL SULFATE HFA 108 (90 BASE) MCG/ACT IN AERS
2.0000 | INHALATION_SPRAY | Freq: Once | RESPIRATORY_TRACT | Status: AC
  Administered 2022-01-07: 2 via RESPIRATORY_TRACT
  Filled 2022-01-07: qty 6.7

## 2022-01-07 NOTE — Discharge Instructions (Signed)
You are seen in the emergency room today with occasional shortness of breath with diarrhea.  You may take over-the-counter medications for diarrhea.  I have sent you home with an inhaler.  Please coordinate with your primary care doctor for a follow-up appointment to reassess your symptoms and direct any further care as an outpatient.  If you develop any new or suddenly worsening symptoms please return to the ED for evaluation.  ?

## 2022-01-07 NOTE — ED Triage Notes (Signed)
Patients of episodes of dyspnea that started this morning. Patient states she had an bout of diarrhea this morning that was followed by the first episode of shortness of breath. Patient is alert, oriented, and in no apparent distress at this time. ?

## 2022-01-07 NOTE — ED Provider Notes (Signed)
? ?Emergency Department Provider Note ? ? ?I have reviewed the triage vital signs and the nursing notes. ? ? ?HISTORY ? ?Chief Complaint ?Shortness of Breath ? ? ?HPI ?Taylor Stevens is a 31 y.o. female with past medical history reviewed below presents emergency department with diarrhea and some shortness of breath.  She she denies any weakness.  Symptoms began today.  She had 1 episode of nonbloody diarrhea without abdominal pain.  No chest discomfort.  She describes intermittent feeling of being short of breath.  She did use an inhaler as a child but has not had any asthma attacks as an adult.  No productive cough or hemoptysis.  ? ? ?Past Medical History:  ?Diagnosis Date  ? GERD (gastroesophageal reflux disease)   ? Guillain Barr? syndrome (HCC)   ? ? ?Review of Systems ? ?Constitutional: No fever/chills ?Eyes: No visual changes. ?ENT: No sore throat. ?Cardiovascular: Denies chest pain. ?Respiratory: Intermittent shortness of breath. ?Gastrointestinal: No abdominal pain.  No nausea, no vomiting. Positive diarrhea.  No constipation. ?Genitourinary: Negative for dysuria. ?Musculoskeletal: Negative for back pain. ?Skin: Negative for rash. ?Neurological: Negative for headaches, focal weakness or numbness. ? ? ?____________________________________________ ? ? ?PHYSICAL EXAM: ? ?VITAL SIGNS: ?ED Triage Vitals  ?Enc Vitals Group  ?   BP 01/07/22 1055 112/78  ?   Pulse Rate 01/07/22 1055 78  ?   Resp 01/07/22 1055 16  ?   Temp 01/07/22 1055 98.6 ?F (37 ?C)  ?   Temp Source 01/07/22 1055 Oral  ?   SpO2 01/07/22 1055 100 %  ? ?Constitutional: Alert and oriented. Well appearing and in no acute distress. ?Eyes: Conjunctivae are normal.  ?Head: Atraumatic. ?Nose: No congestion/rhinnorhea. ?Mouth/Throat: Mucous membranes are moist.  Widely patent oropharynx. ?Neck: No stridor.   ?Cardiovascular: Normal rate, regular rhythm. Good peripheral circulation. Grossly normal heart sounds.   ?Respiratory: Normal respiratory effort.   No retractions. Lungs CTAB. ?Gastrointestinal: Soft and nontender. No distention.  ?Musculoskeletal: No gross deformities of extremities. ?Neurologic:  Normal speech and language. No gross focal neurologic deficits are appreciated.  ?Skin:  Skin is warm, dry and intact. No rash noted. ? ? ?____________________________________________ ?  ?LABS ?(all labs ordered are listed, but only abnormal results are displayed) ? ?Labs Reviewed  ?BASIC METABOLIC PANEL - Abnormal; Notable for the following components:  ?    Result Value  ? Potassium 3.4 (*)   ? Calcium 8.7 (*)   ? All other components within normal limits  ?URINALYSIS, ROUTINE W REFLEX MICROSCOPIC - Abnormal; Notable for the following components:  ? APPearance HAZY (*)   ? Hgb urine dipstick MODERATE (*)   ? Ketones, ur 20 (*)   ? Bacteria, UA FEW (*)   ? All other components within normal limits  ?RESP PANEL BY RT-PCR (FLU A&B, COVID) ARPGX2  ?CBC  ?CBG MONITORING, ED  ?I-STAT BETA HCG BLOOD, ED ([facility], [facility], [facility] ONLY)  ? ?____________________________________________ ? ?EKG ? ? EKG Interpretation ? ?Date/Time:  Friday January 07 2022 10:57:26 EDT ?Ventricular Rate:  86 ?PR Interval:  188 ?QRS Duration: 70 ?QT Interval:  360 ?QTC Calculation: 430 ?R Axis:   63 ?Text Interpretation: Normal sinus rhythm Normal ECG No significant change since last tracing Confirmed by Melene Plan [phone]) on 01/07/2022 2:34:00 PM ?  ? ?  ? ? ?____________________________________________ ? ?RADIOLOGY ? ?DG Chest 2 View ? ?Result Date: 01/07/2022 ?CLINICAL DATA:  Shortness of breath EXAM: CHEST - 2 VIEW COMPARISON:  None. FINDINGS: The heart  size and mediastinal contours are within normal limits. Both lungs are clear. The visualized skeletal structures are unremarkable. IMPRESSION: No active cardiopulmonary disease. Electronically Signed   By: Allegra Lai M.D.   On: 01/07/2022 15:47   ? ?____________________________________________ ? ? ?PROCEDURES ? ?Procedure(s) performed:   ? ?Procedures ? ?None ?____________________________________________ ? ? ?INITIAL IMPRESSION / ASSESSMENT AND PLAN / ED COURSE ? ?Pertinent labs & imaging results that were available during my care of the patient were reviewed by me and considered in my medical decision making (see chart for details). ?  ?This patient is Presenting for Evaluation of SOB, which does require a range of treatment options, and is a complaint that involves a high risk of morbidity and mortality. ? ?The Differential Diagnoses include CAP, asthma flare, CHF, PE, viral illness. ? ?Critical Interventions-  ?  ?Medications  ?albuterol (VENTOLIN HFA) 108 (90 Base) MCG/ACT inhaler 2 puff (2 puffs Inhalation Given 01/07/22 1554)  ? ? ?Reassessment after intervention: No active SOB symptoms. ? ? ?I decided to review pertinent External Data, and in summary no recent ED visits for similar. ?  ?Clinical Laboratory Tests Ordered, included no leukocytosis or anemia.  No urinary tract infection.  No acute kidney injury. COVID and Flu negative.  ? ?Radiologic Tests Ordered, included CXR. I independently interpreted the images and agree with radiology interpretation.  ? ?Cardiac Monitor Tracing which shows NSR. ? ? ?Social Determinants of Health Risk patient is not a smoker. ? ? ?Medical Decision Making: Summary:  ?Patient presents the emergency department with intermittent shortness of breath and some diarrhea.  Lungs are clear without wheezing.  No oropharyngeal findings.  No concern clinically for anaphylaxis or angioedema based on history and exam.  Abdomen is diffusely soft and nontender.  Reviewed labs from the [facility] process.  Have added on chest x-ray and viral panel.  Will give an albuterol MDI.  ? ?Reevaluation with update and discussion with patient.  Chest x-ray without pneumonia.  No COVID or flu. ? ?Disposition: discharge ? ?____________________________________________ ? ?FINAL CLINICAL IMPRESSION(S) / ED DIAGNOSES ? ?Final diagnoses:  ?SOB  (shortness of breath)  ?Diarrhea, unspecified type  ? ? ?Note:  This document was prepared using Dragon voice recognition software and may include unintentional dictation errors. ? ?Alona Bene, MD, FACEP ?Emergency Medicine ? ?  ?Maia Plan, MD ?01/07/22 1720 ? ?

## 2022-01-07 NOTE — ED Notes (Signed)
Patient transported to X-ray 

## 2022-08-26 ENCOUNTER — Other Ambulatory Visit: Payer: Self-pay

## 2022-08-26 ENCOUNTER — Emergency Department (HOSPITAL_BASED_OUTPATIENT_CLINIC_OR_DEPARTMENT_OTHER): Payer: BC Managed Care – PPO | Admitting: Radiology

## 2022-08-26 ENCOUNTER — Emergency Department (HOSPITAL_BASED_OUTPATIENT_CLINIC_OR_DEPARTMENT_OTHER)
Admission: EM | Admit: 2022-08-26 | Discharge: 2022-08-26 | Disposition: A | Discharge status: Home / Self Care | Payer: BC Managed Care – PPO | Attending: Emergency Medicine | Admitting: Emergency Medicine

## 2022-08-26 ENCOUNTER — Encounter (HOSPITAL_BASED_OUTPATIENT_CLINIC_OR_DEPARTMENT_OTHER): Payer: Self-pay | Admitting: Emergency Medicine

## 2022-08-26 DIAGNOSIS — R0602 Shortness of breath: Secondary | ICD-10-CM | POA: Insufficient documentation

## 2022-08-26 DIAGNOSIS — E162 Hypoglycemia, unspecified: Secondary | ICD-10-CM | POA: Diagnosis not present

## 2022-08-26 DIAGNOSIS — R079 Chest pain, unspecified: Secondary | ICD-10-CM | POA: Diagnosis present

## 2022-08-26 LAB — BASIC METABOLIC PANEL
Anion gap: 15 (ref 5–15)
BUN: 13 mg/dL (ref 6–20)
CO2: 22 mmol/L (ref 22–32)
Calcium: 9.5 mg/dL (ref 8.9–10.3)
Chloride: 100 mmol/L (ref 98–111)
Creatinine, Ser: 0.76 mg/dL (ref 0.44–1.00)
GFR, Estimated: >60 mL/min (ref >60)
Glucose, Bld: 61 mg/dL — ABNORMAL LOW (ref 70–99)
Potassium: 3.6 mmol/L (ref 3.5–5.1)
Sodium: 137 mmol/L (ref 135–145)

## 2022-08-26 LAB — D-DIMER, QUANTITATIVE: D-Dimer, Quant: <0.27 ug/mL-FEU (ref 0.00–0.50)

## 2022-08-26 LAB — TROPONIN I (HIGH SENSITIVITY)
Troponin I (High Sensitivity): <2 ng/L (ref <18)
Troponin I (High Sensitivity): <2 ng/L (ref <18)

## 2022-08-26 LAB — CBC
HCT: 41.4 % (ref 36.0–46.0)
Hemoglobin: 13.9 g/dL (ref 12.0–15.0)
MCH: 32.7 pg (ref 26.0–34.0)
MCHC: 33.6 g/dL (ref 30.0–36.0)
MCV: 97.4 fL (ref 80.0–100.0)
Platelets: 252 10*3/uL (ref 150–400)
RBC: 4.25 MIL/uL (ref 3.87–5.11)
RDW: 11.7 % (ref 11.5–15.5)
WBC: 8.2 10*3/uL (ref 4.0–10.5)
nRBC: 0 % (ref 0.0–0.2)

## 2022-08-26 LAB — HEPATIC FUNCTION PANEL
ALT: 11 U/L (ref 0–44)
AST: 16 U/L (ref 15–41)
Albumin: 4.5 g/dL (ref 3.5–5.0)
Alkaline Phosphatase: 36 U/L — ABNORMAL LOW (ref 38–126)
Bilirubin, Direct: 0.2 mg/dL (ref 0.0–0.2)
Indirect Bilirubin: 0.6 mg/dL (ref 0.3–0.9)
Total Bilirubin: 0.8 mg/dL (ref 0.3–1.2)
Total Protein: 8.1 g/dL (ref 6.5–8.1)

## 2022-08-26 LAB — PREGNANCY, URINE: Preg Test, Ur: NEGATIVE

## 2022-08-26 MED ORDER — ALUM & MAG HYDROXIDE-SIMETH 200-200-20 MG/5ML PO SUSP
30.0000 mL | Freq: Once | ORAL | Status: AC
  Administered 2022-08-26: 30 mL via ORAL
  Filled 2022-08-26: qty 30

## 2022-08-26 MED ORDER — LIDOCAINE VISCOUS HCL 2 % MT SOLN
15.0000 mL | Freq: Once | OROMUCOSAL | Status: AC
  Administered 2022-08-26: 15 mL via ORAL
  Filled 2022-08-26: qty 15

## 2022-08-26 MED ORDER — KETOROLAC TROMETHAMINE 15 MG/ML IJ SOLN
15.0000 mg | Freq: Once | INTRAMUSCULAR | Status: DC
  Filled 2022-08-26: qty 1

## 2022-08-26 MED ORDER — OMEPRAZOLE 20 MG PO CPDR: 20.0000 mg | DELAYED_RELEASE_CAPSULE | Freq: Every day | ORAL | 1 refills | Status: DC

## 2022-08-26 NOTE — ED Provider Notes (Signed)
[facility] EMERGENCY DEPT Provider Note   CSN: [id number] Arrival date & time: 08/26/22  1535     History  Chief Complaint  Patient presents with   Shortness of Breath    Taylor Stevens is a 31 y.o. female.   Shortness of Breath    Patient with medical history of GERD and Guillain-Barr presents to the emergency department due to shortness of breath and chest pain.  This has been going on intermittently for weeks but starting last week after eating a cookout and is going worse.  It is constant, feels like tightness.  Does not radiate elsewhere, it is worse after she eats sometimes.  There is no abdominal pain, nausea or vomiting.  Denies any history of ACS, she does have family history of ACS in her mom side.  She has an IUD which is hormonal, does not take oral birth control.  No history of PE, no recent travel or surgeries.  She tried taking Prilosec, took her first dose today and has not noticed much of an improvement.  Home Medications Prior to Admission medications   Medication Sig Start Date End Date Taking? Authorizing Provider  omeprazole (PRILOSEC) 20 MG capsule Take 1 capsule (20 mg total) by mouth daily. 08/26/22  Yes Theron Arista, PA-C  ASHWAGANDHA PO Take 1 tablet by mouth daily.    [provider]  diphenhydrAMINE (BENADRYL) 25 MG tablet Take 1 tablet (25 mg total) by mouth every 6 (six) hours. 03/27/17 03/12/22  Shaune Pollack, MD  docusate sodium (COLACE) 100 MG capsule Take 1 capsule (100 mg total) by mouth every 12 (twelve) hours. Patient not taking: Reported on 08/22/2021 05/15/17   Arby Barrette, MD  hydrOXYzine (ATARAX/VISTARIL) 25 MG tablet Take 1 tablet (25 mg total) by mouth at bedtime as needed. Patient not taking: Reported on 08/22/2021 11/08/19   Army Melia A, PA-C  Multiple Vitamins-Minerals (MULTIVITAMIN WITH MINERALS) tablet Take 1 tablet by mouth daily.    [provider]  Pseudoeph-Doxylamine-DM-APAP (NYQUIL PO)  Take 15 mLs by mouth daily as needed (headache).    [provider]      Allergies    Shellfish allergy    Review of Systems   Review of Systems  Respiratory:  Positive for shortness of breath.     Physical Exam Updated Vital Signs BP 113/80   Pulse 77   Temp 98.5 F (36.9 C) (Oral)   Resp 18   LMP 08/21/2022   SpO2 100%  Physical Exam Vitals and nursing note reviewed. Exam conducted with a chaperone present.  Constitutional:      Appearance: Normal appearance.  HENT:     Head: Normocephalic and atraumatic.  Eyes:     General: No scleral icterus.       Right eye: No discharge.        Left eye: No discharge.     Extraocular Movements: Extraocular movements intact.     Pupils: Pupils are equal, round, and reactive to light.  Cardiovascular:     Rate and Rhythm: Normal rate and regular rhythm.     Pulses: Normal pulses.     Heart sounds: Normal heart sounds. No murmur heard.    No friction rub. No gallop.  Pulmonary:     Effort: Pulmonary effort is normal. No respiratory distress.     Breath sounds: Normal breath sounds.  Abdominal:     General: Abdomen is flat. Bowel sounds are normal. There is no distension.     Palpations:  Abdomen is soft.     Tenderness: There is no abdominal tenderness.     Comments: Abdomen is nontender, no right upper quadrant tenderness or Murphy  Skin:    General: Skin is warm and dry.     Coloration: Skin is not jaundiced.  Neurological:     Mental Status: She is alert. Mental status is at baseline.     Coordination: Coordination normal.     ED Results / Procedures / Treatments   Labs (all labs ordered are listed, but only abnormal results are displayed) Labs Reviewed  BASIC METABOLIC PANEL - Abnormal; Notable for the following components:      Result Value   Glucose, Bld 61 (*)    All other components within normal limits  HEPATIC FUNCTION PANEL - Abnormal; Notable for the following components:   Alkaline Phosphatase 36  (*)    All other components within normal limits  CBC  PREGNANCY, URINE  D-DIMER, QUANTITATIVE  TROPONIN I (HIGH SENSITIVITY)  TROPONIN I (HIGH SENSITIVITY)    EKG EKG Interpretation  Date/Time:  Friday August 26 2022 16:21:44 EST Ventricular Rate:  92 PR Interval:  188 QRS Duration: 70 QT Interval:  344 QTC Calculation: 425 R Axis:   72 Text Interpretation: Normal sinus rhythm Nonspecific T wave abnormality Abnormal ECG When compared with ECG of 07-Jan-2022 10:57, No significant change was found T wave inversions v2-v4 Confirmed by Glynn Octave [phone]) on 08/26/2022 8:36:28 PM  Radiology DG Chest 2 View  Result Date: 08/26/2022 CLINICAL DATA:  Chest pain. EXAM: CHEST - 2 VIEW COMPARISON:  01/07/2022 FINDINGS: The heart size and mediastinal contours are within normal limits. Both lungs are clear. The visualized skeletal structures are unremarkable. IMPRESSION: No active cardiopulmonary disease. Electronically Signed   By: Norva Pavlov M.D.   On: 08/26/2022 16:38    Procedures Procedures    Medications Ordered in ED Medications  alum & mag hydroxide-simeth (MAALOX/MYLANTA) 200-200-20 MG/5ML suspension 30 mL (30 mLs Oral Given 08/26/22 2121)    And  lidocaine (XYLOCAINE) 2 % viscous mouth solution 15 mL (15 mLs Oral Given 08/26/22 2121)    ED Course/ Medical Decision Making/ A&P                           Medical Decision Making Amount and/or Complexity of Data Reviewed Labs: ordered. Radiology: ordered.  Risk OTC drugs. Prescription drug management.   This is a 31 year old female presenting today due to chest pain shortness of breath for weeks but worsening in the last few days.  Differential includes but not limited to PE, pneumonia, ACS, dissection, pericarditis, GERD.  On a exam patient is speaking full sentences right upper and lower extremity pulses are symmetric, lungs are clear to auscultation and regular rhythm without any murmurs appreciated.  She  is not hypoxic, in sinus rhythm.  Abdomen is nontender. -BP 113/80   Pulse 77   Temp 98.4 F (36.9 C) (Oral)   Resp 18   LMP 08/21/2022   SpO2 100%   I ordered, viewed and interpreted laboratory workup.   -CBC - No leukocytosis or anemia, glucose actually slightly decreased at 61.   Troponin is less than 2. -Dimer is negative.   -Patient is not pregnant -no transamitnitis  Chest x-ray is negative for acute process or underlying pneumonia.  EKG shows nonspecific T wave inversions which were present previously on EKGs.  I ordered Maalox.  Reeval the patient had some improvement.  Asymptomatic currently.  I do not think is ACS given negative troponin and no specific ischemic changes on EKG compared to previous.  I considered atypical cholecystitis but no abdominal tenderness no transaminitis that seems unlikely.  Normal pulses to upper and lower extremity, also with age and lack of risk factors dissection is unlikely.    Given negative dimer I do not think it is PE especially given not hypoxic, tachycardic and very low risk factor for it.Suspect possible GERD given postprandial nature.  I discussed with the patient that I would like her to stay on the Prilosec consistently and that she may not notice for the first week as it takes time to start working.    Considered admission but patient's pain improved, heart score is 2 and vitals are very stable with negative and reassuring workup so I think she is appropriate for close outpatient follow-up with primary care         Final Clinical Impression(s) / ED Diagnoses Final diagnoses:  Chest pain, unspecified type    Rx / DC Orders ED Discharge Orders          Ordered    omeprazole (PRILOSEC) 20 MG capsule  Daily        08/26/22 2154              Theron Arista, PA-C 08/26/22 2158    Glynn Octave, MD 08/26/22 2345

## 2022-08-26 NOTE — ED Triage Notes (Signed)
Intermittent episodes of sob, and chest tightness. Relieved after rest and drinking water helps. Can be correlated to eating at times.  No distress noted in triage.

## 2022-08-26 NOTE — Discharge Instructions (Addendum)
You were seen today in the emergency department due to chest pain.  Your x-ray was reassuring, I do not think it is a heart attack given normal cardiac enzymes, your chest x-ray was clear without any signs of infection and D-dimer which is a blood clot marker was negative.  Given that is worse after eating I do think it is possible this could be reflux, try taking the Prilosec 30 minutes before your first meal of the day for the next 4 weeks.  Avoid any acidic food, please keep a symptom diary of when you have the pain, what it feels like and what to do and anything makes it get better.  If the pain becomes constant severe or you have new or concerning symptoms return to the emergency department for additional evaluation.  Otherwise follow-up with your primary doctor as planned.  Please call your primary Monday with no you are seen in the emergency department today to get in before mid December.

## 2023-01-05 ENCOUNTER — Ambulatory Visit: Payer: BC Managed Care – PPO | Admitting: Allergy & Immunology

## 2023-05-06 ENCOUNTER — Other Ambulatory Visit: Payer: Self-pay

## 2023-05-06 ENCOUNTER — Emergency Department (HOSPITAL_COMMUNITY)
Admission: EM | Admit: 2023-05-06 | Discharge: 2023-05-06 | Disposition: A | Discharge status: Home / Self Care | Payer: BC Managed Care – PPO | Attending: Emergency Medicine | Admitting: Emergency Medicine

## 2023-05-06 ENCOUNTER — Encounter (HOSPITAL_COMMUNITY): Payer: Self-pay

## 2023-05-06 DIAGNOSIS — R109 Unspecified abdominal pain: Secondary | ICD-10-CM | POA: Diagnosis present

## 2023-05-06 DIAGNOSIS — K2900 Acute gastritis without bleeding: Secondary | ICD-10-CM | POA: Diagnosis not present

## 2023-05-06 DIAGNOSIS — K219 Gastro-esophageal reflux disease without esophagitis: Secondary | ICD-10-CM | POA: Insufficient documentation

## 2023-05-06 LAB — URINALYSIS, ROUTINE W REFLEX MICROSCOPIC
Bilirubin Urine: NEGATIVE
Glucose, UA: NEGATIVE mg/dL
Hgb urine dipstick: NEGATIVE
Ketones, ur: NEGATIVE mg/dL
Leukocytes,Ua: NEGATIVE
Nitrite: NEGATIVE
Protein, ur: NEGATIVE mg/dL
Specific Gravity, Urine: 1.025 (ref 1.005–1.030)
pH: 5 (ref 5.0–8.0)

## 2023-05-06 LAB — COMPREHENSIVE METABOLIC PANEL
ALT: 14 U/L (ref 0–44)
AST: 18 U/L (ref 15–41)
Albumin: 3.7 g/dL (ref 3.5–5.0)
Alkaline Phosphatase: 37 U/L — ABNORMAL LOW (ref 38–126)
Anion gap: 10 (ref 5–15)
BUN: 9 mg/dL (ref 6–20)
CO2: 24 mmol/L (ref 22–32)
Calcium: 9 mg/dL (ref 8.9–10.3)
Chloride: 103 mmol/L (ref 98–111)
Creatinine, Ser: 0.8 mg/dL (ref 0.44–1.00)
GFR, Estimated: >60 mL/min (ref >60)
Glucose, Bld: 108 mg/dL — ABNORMAL HIGH (ref 70–99)
Potassium: 3.8 mmol/L (ref 3.5–5.1)
Sodium: 137 mmol/L (ref 135–145)
Total Bilirubin: 0.6 mg/dL (ref 0.3–1.2)
Total Protein: 7 g/dL (ref 6.5–8.1)

## 2023-05-06 LAB — CBC
HCT: 38.2 % (ref 36.0–46.0)
Hemoglobin: 12.9 g/dL (ref 12.0–15.0)
MCH: 32.9 pg (ref 26.0–34.0)
MCHC: 33.8 g/dL (ref 30.0–36.0)
MCV: 97.4 fL (ref 80.0–100.0)
Platelets: 232 10*3/uL (ref 150–400)
RBC: 3.92 MIL/uL (ref 3.87–5.11)
RDW: 11.2 % — ABNORMAL LOW (ref 11.5–15.5)
WBC: 7.6 10*3/uL (ref 4.0–10.5)
nRBC: 0 % (ref 0.0–0.2)

## 2023-05-06 LAB — HCG, SERUM, QUALITATIVE: Preg, Serum: NEGATIVE

## 2023-05-06 LAB — TSH: TSH: 0.769 u[IU]/mL (ref 0.350–4.500)

## 2023-05-06 LAB — LIPASE, BLOOD: Lipase: 38 U/L (ref 11–51)

## 2023-05-06 MED ORDER — ALUM & MAG HYDROXIDE-SIMETH 200-200-20 MG/5ML PO SUSP
30.0000 mL | Freq: Once | ORAL | Status: AC
  Administered 2023-05-06: 30 mL via ORAL
  Filled 2023-05-06: qty 30

## 2023-05-06 MED ORDER — ONDANSETRON 4 MG PO TBDP
4.0000 mg | ORAL_TABLET | Freq: Once | ORAL | Status: AC
  Administered 2023-05-06: 4 mg via ORAL
  Filled 2023-05-06: qty 1

## 2023-05-06 MED ORDER — SUCRALFATE 1 G PO TABS: 1.0000 g | ORAL_TABLET | Freq: Three times a day (TID) | ORAL | 1 refills | Status: DC

## 2023-05-06 NOTE — ED Provider Notes (Signed)
[place] EMERGENCY DEPARTMENT AT [facility] Provider Note   CSN: [id number] Arrival date & time: 05/06/23  1810     History  Chief Complaint  Patient presents with   Abdominal Pain    Taylor Stevens is a 32 y.o. female.  Patient is a pleasant 32 year old female with a history of Guillain-Barr syndrome and GERD who is presenting today with complaints of abdominal pain and a fullness in her throat.  She reports that she has had these issues before but this time it has been approximately been since Wednesday.  She reports feeling of fullness in her throat but also pain in her upper abdomen.  The pain in her upper abdomen does feel worse after eating and she has not had much of an appetite recently.  She denies any pain in her chest or shortness of breath.  She has not had fever or vomiting but did have nausea today after just eating a banana.  She has had normal menses, no urinary symptoms but also noticed today with a bowel movement there was blood when she wiped.  Her stool has been black but that was after taking Pepto-Bismol and cannot really say if there was any blood mixed with her stool.  She has had this intermittently in the past but today seemed more than what she had had in the past.  She denies any rectal pain and typically has bowel movements daily without a history of diarrhea or constipation.  She takes no over-the-counter medications such as aspirin, Goody powders, BC powders, ibuprofen does not drink regularly and does not smoke.  She has been under a lot of stress and reports she is not sleeping well.  3 days ago she did start taking Prilosec but does not feel like it is made a big difference.  The history is provided by the patient.  Abdominal Pain      Home Medications Prior to Admission medications   Medication Sig Start Date End Date Taking? Authorizing Provider  ASHWAGANDHA PO Take 1 tablet by mouth daily.    [provider]  diphenhydrAMINE  (BENADRYL) 25 MG tablet Take 1 tablet (25 mg total) by mouth every 6 (six) hours. 03/27/17 03/12/22  Shaune Pollack, MD  docusate sodium (COLACE) 100 MG capsule Take 1 capsule (100 mg total) by mouth every 12 (twelve) hours. Patient not taking: Reported on 08/22/2021 05/15/17   Arby Barrette, MD  hydrOXYzine (ATARAX/VISTARIL) 25 MG tablet Take 1 tablet (25 mg total) by mouth at bedtime as needed. Patient not taking: Reported on 08/22/2021 11/08/19   Army Melia A, PA-C  Multiple Vitamins-Minerals (MULTIVITAMIN WITH MINERALS) tablet Take 1 tablet by mouth daily.    [provider]  omeprazole (PRILOSEC) 20 MG capsule Take 1 capsule (20 mg total) by mouth daily. 08/26/22   Theron Arista, PA-C  Pseudoeph-Doxylamine-DM-APAP (NYQUIL PO) Take 15 mLs by mouth daily as needed (headache).    [provider]  sucralfate (CARAFATE) 1 g tablet Take 1 tablet (1 g total) by mouth 4 (four) times daily -  with meals and at bedtime. 05/06/23   Gwyneth Sprout, MD      Allergies    Shellfish allergy    Review of Systems   Review of Systems  Gastrointestinal:  Positive for abdominal pain.    Physical Exam Updated Vital Signs BP 111/76   Pulse 77   Temp 98.7 F (37.1 C) (Oral)   Resp 20   Ht 5\' 3"  (1.6 m)  Wt 86.2 kg   SpO2 100%   BMI 33.66 kg/m  Physical Exam Vitals and nursing note reviewed.  Constitutional:      General: She is not in acute distress.    Appearance: She is well-developed.  HENT:     Head: Normocephalic and atraumatic.     Mouth/Throat:     Mouth: Mucous membranes are moist.  Eyes:     General:        Right eye: Right eye discharge: .wpco.     Pupils: Pupils are equal, round, and reactive to light.  Neck:     Comments: No thyromegaly, voice changes or stridor Cardiovascular:     Rate and Rhythm: Normal rate and regular rhythm.     Heart sounds: Normal heart sounds. No murmur heard.    No friction rub.  Pulmonary:     Effort: Pulmonary effort is  normal.     Breath sounds: Normal breath sounds. No wheezing or rales.  Abdominal:     General: Bowel sounds are normal. There is no distension.     Palpations: Abdomen is soft.     Tenderness: There is abdominal tenderness in the epigastric area. There is no right CVA tenderness, left CVA tenderness, guarding or rebound.  Genitourinary:    Comments: No hemorrhoids noted on external rectal exam Musculoskeletal:        General: No tenderness. Normal range of motion.     Cervical back: Neck supple.     Comments: No edema  Skin:    General: Skin is warm and dry.     Findings: No rash.  Neurological:     Mental Status: She is alert and oriented to person, place, and time. Mental status is at baseline.     Cranial Nerves: No cranial nerve deficit.  Psychiatric:        Behavior: Behavior normal.     ED Results / Procedures / Treatments   Labs (all labs ordered are listed, but only abnormal results are displayed) Labs Reviewed  COMPREHENSIVE METABOLIC PANEL - Abnormal; Notable for the following components:      Result Value   Glucose, Bld 108 (*)    Alkaline Phosphatase 37 (*)    All other components within normal limits  CBC - Abnormal; Notable for the following components:   RDW 11.2 (*)    All other components within normal limits  URINALYSIS, ROUTINE W REFLEX MICROSCOPIC - Abnormal; Notable for the following components:   APPearance HAZY (*)    All other components within normal limits  LIPASE, BLOOD  HCG, SERUM, QUALITATIVE  TSH    EKG None  Radiology No results found.  Procedures Procedures    Medications Ordered in ED Medications  alum & mag hydroxide-simeth (MAALOX/MYLANTA) 200-200-20 MG/5ML suspension 30 mL (30 mLs Oral Given 05/06/23 1933)  ondansetron (ZOFRAN-ODT) disintegrating tablet 4 mg (4 mg Oral Given 05/06/23 1933)    ED Course/ Medical Decision Making/ A&P                                 Medical Decision Making Amount and/or Complexity of Data  Reviewed Labs: ordered.  Risk OTC drugs. Prescription drug management.   Pt presenting today with a complaint that caries a high risk for morbidity and mortality.  Here today with abdominal pain and blood in her stool.  Pt with hx of prior but reports has never been this pronounced.  Well  appearing on exam with normal VS.  Low suspicion for GU pathology such as pyelonephritis, kidney stone or STI.  Pt has IUD and low suspicion for pregnancy.  Pt has upper pain most likely from gastritis and GERD but could also have IBD.  No evidence of external hemorrhoids.  No findings or thyromegally. Patient has no lower abdominal pain concerning for appendicitis, diverticulitis.  I independently interpreted patient's labs and thyroid, CBC, CMP, lipase all without acute findings.  UA is negative and UPT negative.  Findings discussed with the patient.  At this time we will treat for GERD and gastritis.  Discussed with patient importance of following up with GI for the future and keeping a food log.  Also added on Carafate to be taken with the Prilosec she had already started 3 days ago.  At this time there is no indication for CT scanning which was discussed with the patient and she is in agreement.  Did give return precautions.  Patient also has a PCP she will follow-up with but she was also given information for gastroenterology.         Final Clinical Impression(s) / ED Diagnoses Final diagnoses:  Acute gastritis without hemorrhage, unspecified gastritis type  Gastroesophageal reflux disease without esophagitis    Rx / DC Orders ED Discharge Orders          Ordered    sucralfate (CARAFATE) 1 g tablet  3 times daily with meals & bedtime,   Status:  Discontinued        05/06/23 2214    sucralfate (CARAFATE) 1 g tablet  3 times daily with meals & bedtime        05/06/23 2249              Gwyneth Sprout, MD 05/06/23 2353

## 2023-05-06 NOTE — ED Triage Notes (Signed)
Pt arrives with c/o ABD pain that started a few days ago. Pt denies n/v.

## 2023-05-06 NOTE — Discharge Instructions (Addendum)
Take the Prilosec 20 mg daily for the next 2 weeks.  You can use the Carafate as needed for the discomfort.  Continue to avoid any type of anti-inflammatory medication such as ibuprofen Aleve or aspirin.  Also avoid spicy foods and eating before bed.  Keep an eye on the food you are eating and what seems to trigger your symptoms.

## 2023-05-09 ENCOUNTER — Other Ambulatory Visit: Payer: Self-pay

## 2023-05-09 ENCOUNTER — Emergency Department (HOSPITAL_BASED_OUTPATIENT_CLINIC_OR_DEPARTMENT_OTHER): Payer: BC Managed Care – PPO

## 2023-05-09 ENCOUNTER — Emergency Department (HOSPITAL_BASED_OUTPATIENT_CLINIC_OR_DEPARTMENT_OTHER)
Admission: EM | Admit: 2023-05-09 | Discharge: 2023-05-09 | Disposition: A | Discharge status: Home / Self Care | Payer: BC Managed Care – PPO | Attending: Emergency Medicine | Admitting: Emergency Medicine

## 2023-05-09 ENCOUNTER — Encounter: Payer: Self-pay | Admitting: Gastroenterology

## 2023-05-09 ENCOUNTER — Encounter (HOSPITAL_BASED_OUTPATIENT_CLINIC_OR_DEPARTMENT_OTHER): Payer: Self-pay | Admitting: Emergency Medicine

## 2023-05-09 ENCOUNTER — Other Ambulatory Visit (HOSPITAL_BASED_OUTPATIENT_CLINIC_OR_DEPARTMENT_OTHER): Payer: Self-pay

## 2023-05-09 DIAGNOSIS — K529 Noninfective gastroenteritis and colitis, unspecified: Secondary | ICD-10-CM | POA: Diagnosis not present

## 2023-05-09 DIAGNOSIS — R1084 Generalized abdominal pain: Secondary | ICD-10-CM

## 2023-05-09 DIAGNOSIS — R109 Unspecified abdominal pain: Secondary | ICD-10-CM | POA: Diagnosis present

## 2023-05-09 LAB — COMPREHENSIVE METABOLIC PANEL
ALT: 9 U/L (ref 0–44)
AST: 17 U/L (ref 15–41)
Albumin: 4 g/dL (ref 3.5–5.0)
Alkaline Phosphatase: 30 U/L — ABNORMAL LOW (ref 38–126)
Anion gap: 10 (ref 5–15)
BUN: 11 mg/dL (ref 6–20)
CO2: 20 mmol/L — ABNORMAL LOW (ref 22–32)
Calcium: 8.7 mg/dL — ABNORMAL LOW (ref 8.9–10.3)
Chloride: 103 mmol/L (ref 98–111)
Creatinine, Ser: 0.73 mg/dL (ref 0.44–1.00)
GFR, Estimated: >60 mL/min (ref >60)
Glucose, Bld: 117 mg/dL — ABNORMAL HIGH (ref 70–99)
Potassium: 3.9 mmol/L (ref 3.5–5.1)
Sodium: 133 mmol/L — ABNORMAL LOW (ref 135–145)
Total Bilirubin: 0.9 mg/dL (ref 0.3–1.2)
Total Protein: 7.3 g/dL (ref 6.5–8.1)

## 2023-05-09 LAB — CBC WITH DIFFERENTIAL/PLATELET
Abs Immature Granulocytes: 0.01 10*3/uL (ref 0.00–0.07)
Basophils Absolute: 0 10*3/uL (ref 0.0–0.1)
Basophils Relative: 1 %
Eosinophils Absolute: 0.1 10*3/uL (ref 0.0–0.5)
Eosinophils Relative: 1 %
HCT: 37.7 % (ref 36.0–46.0)
Hemoglobin: 13.1 g/dL (ref 12.0–15.0)
Immature Granulocytes: 0 %
Lymphocytes Relative: 33 %
Lymphs Abs: 2 10*3/uL (ref 0.7–4.0)
MCH: 32.4 pg (ref 26.0–34.0)
MCHC: 34.7 g/dL (ref 30.0–36.0)
MCV: 93.3 fL (ref 80.0–100.0)
Monocytes Absolute: 0.3 10*3/uL (ref 0.1–1.0)
Monocytes Relative: 4 %
Neutro Abs: 3.8 10*3/uL (ref 1.7–7.7)
Neutrophils Relative %: 61 %
Platelets: 226 10*3/uL (ref 150–400)
RBC: 4.04 MIL/uL (ref 3.87–5.11)
RDW: 11.1 % — ABNORMAL LOW (ref 11.5–15.5)
WBC: 6.2 10*3/uL (ref 4.0–10.5)
nRBC: 0 % (ref 0.0–0.2)

## 2023-05-09 LAB — URINALYSIS, ROUTINE W REFLEX MICROSCOPIC
Bilirubin Urine: NEGATIVE
Glucose, UA: NEGATIVE mg/dL
Hgb urine dipstick: NEGATIVE
Ketones, ur: 40 mg/dL — AB
Leukocytes,Ua: NEGATIVE
Nitrite: NEGATIVE
Specific Gravity, Urine: >1.046 — ABNORMAL HIGH (ref 1.005–1.030)
pH: 5.5 (ref 5.0–8.0)

## 2023-05-09 LAB — PREGNANCY, URINE: Preg Test, Ur: NEGATIVE

## 2023-05-09 LAB — LIPASE, BLOOD: Lipase: 31 U/L (ref 11–51)

## 2023-05-09 MED ORDER — ONDANSETRON HCL 4 MG PO TABS: 4.0000 mg | ORAL_TABLET | Freq: Three times a day (TID) | ORAL | Status: DC | PRN

## 2023-05-09 MED ORDER — IOHEXOL 300 MG/ML  SOLN
100.0000 mL | Freq: Once | INTRAMUSCULAR | Status: AC | PRN
  Administered 2023-05-09: 75 mL via INTRAVENOUS

## 2023-05-09 MED ORDER — DICYCLOMINE HCL 20 MG PO TABS: 20.0000 mg | ORAL_TABLET | Freq: Two times a day (BID) | ORAL | 0 refills | Status: DC

## 2023-05-09 NOTE — ED Triage Notes (Signed)
Pt arrives to ED with c/o abdominal pain x6 days. Was seen on 8/3 and dx with GERD. Associated with weakness and inability to eat.

## 2023-05-09 NOTE — ED Provider Notes (Signed)
[place] EMERGENCY DEPARTMENT AT [facility] Provider Note   CSN: [id number] Arrival date & time: 05/09/23  1478     History  Chief Complaint  Patient presents with   Abdominal Pain    Taylor Stevens is a 32 y.o. female.  32 year old female here today for abdominal pain.  Patient was seen here this past week for similar symptoms.  She is attributing her symptoms to a [employer] biscuit that she ate earlier in the week.  She says that she has had epigastric pain, some nausea.  She says that she has not wanted to eat as much over the last few days.  She has not had fever or chills.  Patient says that she feels weak, and also jittery.  She says that she has some pain in her right thigh.  She followed up with a ENT doctor 2 days ago, had a laryngoscope done for recurrent bolus sensation.  She was told in the office that her growth was normal.  She does not have any history of intra-abdominal surgeries.   Abdominal Pain      Home Medications Prior to Admission medications   Medication Sig Start Date End Date Taking? Authorizing Provider  sertraline (ZOLOFT) 50 MG tablet  05/04/23  Yes [provider]  ASHWAGANDHA PO Take 1 tablet by mouth daily.    [provider]  diphenhydrAMINE (BENADRYL) 25 MG tablet Take 1 tablet (25 mg total) by mouth every 6 (six) hours. 03/27/17 03/12/22  Shaune Pollack, MD  docusate sodium (COLACE) 100 MG capsule Take 1 capsule (100 mg total) by mouth every 12 (twelve) hours. Patient not taking: Reported on 08/22/2021 05/15/17   Arby Barrette, MD  hydrOXYzine (ATARAX/VISTARIL) 25 MG tablet Take 1 tablet (25 mg total) by mouth at bedtime as needed. Patient not taking: Reported on 08/22/2021 11/08/19   Army Melia A, PA-C  Multiple Vitamins-Minerals (MULTIVITAMIN WITH MINERALS) tablet Take 1 tablet by mouth daily.    [provider]  omeprazole (PRILOSEC) 20 MG capsule Take 1 capsule (20 mg total) by mouth daily. 08/26/22    Theron Arista, PA-C  Pseudoeph-Doxylamine-DM-APAP (NYQUIL PO) Take 15 mLs by mouth daily as needed (headache).    [provider]  sucralfate (CARAFATE) 1 g tablet Take 1 tablet (1 g total) by mouth 4 (four) times daily -  with meals and at bedtime. 05/06/23   Gwyneth Sprout, MD      Allergies    Shellfish allergy    Review of Systems   Review of Systems  Gastrointestinal:  Positive for abdominal pain.    Physical Exam Updated Vital Signs BP 121/84 (BP Location: Right Arm)   Pulse 86   Resp 16   SpO2 99%  Physical Exam Vitals reviewed.  HENT:     Head: Normocephalic.  Cardiovascular:     Rate and Rhythm: Normal rate.  Pulmonary:     Effort: Pulmonary effort is normal.     Breath sounds: No wheezing.  Abdominal:     General: Abdomen is flat. There is no distension.     Palpations: Abdomen is soft.     Tenderness: There is no abdominal tenderness.     Hernia: There is no hernia in the right femoral area.  Neurological:     Mental Status: She is alert.     ED Results / Procedures / Treatments   Labs (all labs ordered are listed, but only abnormal results are displayed) Labs Reviewed  CBC WITH DIFFERENTIAL/PLATELET  COMPREHENSIVE  METABOLIC PANEL  LIPASE, BLOOD  URINALYSIS, ROUTINE W REFLEX MICROSCOPIC  PREGNANCY, URINE    EKG None  Radiology No results found.  Procedures Procedures    Medications Ordered in ED Medications - No data to display  ED Course/ Medical Decision Making/ A&P   1}                              Medical Decision Making 32 year old female here today for abdominal pain.  Differential diagnoses include gastritis, biliary colic, less likely cholecystitis, less likely cholelithiasis, GERD, less likely acute intra-abdominal infection, less likely ACS.  Plan # patient with benign abdominal exam, reassuring vital signs.  She has had recurrent visits for similar symptoms.  No history of intra-abdominal surgeries. Symptoms do seem  most consistent with a gastritis, rather than a biliary pathology.  Is not exacerbated by food, and is constant.  She is currently taking Prilosec and Carafate  With the patient having recurrent visits, will obtain imaging of the abdomen and pelvis.  I reviewed the patient's labs, no significant elevation in LFTs, lipase.  Her hemoglobin and white blood cell count were normal.  Patient's cardiac monitor shows normal sinus rhythm, no tachycardia.  CT imaging shows likely colitis, could certainly be consistent with the patient's symptoms.  Incidental gallbladder lesion noted.  Discussed with the patient, she will follow-up with her PCP.  I will discharge patient home with prescription for Bentyl and Zofran.  Amount and/or Complexity of Data Reviewed Labs: ordered. Radiology: ordered.  Risk Prescription drug management.      Final Clinical Impression(s) / ED Diagnoses Final diagnoses:  None    Rx / DC Orders ED Discharge Orders     None         Anders Simmonds T, DO 05/09/23 1020

## 2023-05-09 NOTE — Discharge Instructions (Addendum)
While you are in the emergency department, you had the labs done that were normal.  Your CT scan showed what we call colitis, which is inflammation of the intestines.  This is most often viral, however may be caused by things like food poisoning.  I sent you with a prescription for a medication called Bentyl, which can help out with this pain.  You could also take Zofran as needed for nausea.  You may continue taking all of your other medications as prescribed.  If your symptoms do not improve over the next 1 week, I would recommend that you call the GI physician listed in your discharge paperwork.  Please follow-up with your primary care doctor.  On your CT scan, there was an area of your gallbladder that looked a little unusual on the scan.  This is most often nothing to worry about, but we always recommend that you bring this up with your primary care doctor for further testing.  Come back to the emergency department if your symptoms worsen over the next few days.

## 2023-05-09 NOTE — ED Notes (Signed)
Patient verbalizes understanding of discharge instructions. Opportunity for questioning and answers were provided. Patient discharged from ED.  °

## 2023-05-09 NOTE — ED Notes (Signed)
Pt aware of the need for a urine... Unable to currently collect the sample.Marland KitchenMarland Kitchen

## 2023-05-11 ENCOUNTER — Encounter (HOSPITAL_BASED_OUTPATIENT_CLINIC_OR_DEPARTMENT_OTHER): Payer: Self-pay

## 2023-05-11 ENCOUNTER — Emergency Department (HOSPITAL_BASED_OUTPATIENT_CLINIC_OR_DEPARTMENT_OTHER)
Admission: EM | Admit: 2023-05-11 | Discharge: 2023-05-11 | Discharge status: Against Medical Advice | Payer: BC Managed Care – PPO | Attending: Emergency Medicine | Admitting: Emergency Medicine

## 2023-05-11 ENCOUNTER — Emergency Department (HOSPITAL_BASED_OUTPATIENT_CLINIC_OR_DEPARTMENT_OTHER): Payer: BC Managed Care – PPO

## 2023-05-11 ENCOUNTER — Other Ambulatory Visit: Payer: Self-pay

## 2023-05-11 DIAGNOSIS — R140 Abdominal distension (gaseous): Secondary | ICD-10-CM | POA: Diagnosis not present

## 2023-05-11 DIAGNOSIS — R0602 Shortness of breath: Secondary | ICD-10-CM | POA: Diagnosis not present

## 2023-05-11 DIAGNOSIS — Z5321 Procedure and treatment not carried out due to patient leaving prior to being seen by health care provider: Secondary | ICD-10-CM | POA: Diagnosis not present

## 2023-05-11 MED ORDER — ALBUTEROL SULFATE HFA 108 (90 BASE) MCG/ACT IN AERS: 2.0000 | INHALATION_SPRAY | RESPIRATORY_TRACT | Status: DC | PRN

## 2023-05-11 NOTE — ED Provider Notes (Signed)
Pt LWCS prior to evaluation    Sloan Leiter, DO 05/11/23 2141

## 2023-05-11 NOTE — ED Triage Notes (Signed)
Patient arrives with complaints of increased shortness of breath x1 day. Patient reports being recently diagnosed with colitis. Reports increased bloating. No pain.

## 2023-05-11 NOTE — ED Notes (Signed)
Pt states she cannot wait any longer. Encouraged to stay and receive care/ see provider. Delay explained. Pt refuses at this time-seen walking out of room to lobby without incident.

## 2023-05-15 ENCOUNTER — Encounter (HOSPITAL_BASED_OUTPATIENT_CLINIC_OR_DEPARTMENT_OTHER): Payer: Self-pay | Admitting: Emergency Medicine

## 2023-05-15 ENCOUNTER — Other Ambulatory Visit: Payer: Self-pay

## 2023-05-15 DIAGNOSIS — R253 Fasciculation: Secondary | ICD-10-CM | POA: Insufficient documentation

## 2023-05-15 DIAGNOSIS — M25512 Pain in left shoulder: Secondary | ICD-10-CM | POA: Diagnosis not present

## 2023-05-15 DIAGNOSIS — R002 Palpitations: Secondary | ICD-10-CM | POA: Diagnosis not present

## 2023-05-15 DIAGNOSIS — T443X5A Adverse effect of other parasympatholytics [anticholinergics and antimuscarinics] and spasmolytics, initial encounter: Secondary | ICD-10-CM | POA: Insufficient documentation

## 2023-05-15 DIAGNOSIS — Z5321 Procedure and treatment not carried out due to patient leaving prior to being seen by health care provider: Secondary | ICD-10-CM | POA: Insufficient documentation

## 2023-05-15 DIAGNOSIS — R42 Dizziness and giddiness: Secondary | ICD-10-CM | POA: Diagnosis not present

## 2023-05-15 NOTE — ED Triage Notes (Signed)
Pt states that she called the nurse hotline because since she started bentyl she has had arm and leg jerking when she starts to fall asleep and dizziness that starts 10 minutes after she takes the medicine. States that she has always had twitching problems, but the new medicine made her notice it more in different parts of her body.

## 2023-05-16 ENCOUNTER — Emergency Department (HOSPITAL_COMMUNITY)
Admission: EM | Admit: 2023-05-16 | Discharge: 2023-05-16 | Disposition: A | Discharge status: Home / Self Care | Payer: BC Managed Care – PPO | Attending: Emergency Medicine | Admitting: Emergency Medicine

## 2023-05-16 ENCOUNTER — Emergency Department (HOSPITAL_BASED_OUTPATIENT_CLINIC_OR_DEPARTMENT_OTHER)
Admission: EM | Admit: 2023-05-16 | Discharge: 2023-05-16 | Disposition: A | Discharge status: Home / Self Care | Payer: BC Managed Care – PPO | Attending: Emergency Medicine | Admitting: Emergency Medicine

## 2023-05-16 ENCOUNTER — Emergency Department (HOSPITAL_BASED_OUTPATIENT_CLINIC_OR_DEPARTMENT_OTHER)
Admission: EM | Admit: 2023-05-16 | Discharge: 2023-05-16 | Discharge status: Against Medical Advice | Payer: BC Managed Care – PPO | Attending: Emergency Medicine | Admitting: Emergency Medicine

## 2023-05-16 ENCOUNTER — Encounter (HOSPITAL_COMMUNITY): Payer: Self-pay

## 2023-05-16 ENCOUNTER — Other Ambulatory Visit: Payer: Self-pay

## 2023-05-16 ENCOUNTER — Emergency Department (HOSPITAL_BASED_OUTPATIENT_CLINIC_OR_DEPARTMENT_OTHER): Payer: BC Managed Care – PPO | Admitting: Radiology

## 2023-05-16 DIAGNOSIS — M25512 Pain in left shoulder: Secondary | ICD-10-CM | POA: Insufficient documentation

## 2023-05-16 DIAGNOSIS — Z5321 Procedure and treatment not carried out due to patient leaving prior to being seen by health care provider: Secondary | ICD-10-CM | POA: Insufficient documentation

## 2023-05-16 DIAGNOSIS — R002 Palpitations: Secondary | ICD-10-CM | POA: Insufficient documentation

## 2023-05-16 DIAGNOSIS — R42 Dizziness and giddiness: Secondary | ICD-10-CM | POA: Insufficient documentation

## 2023-05-16 DIAGNOSIS — R253 Fasciculation: Secondary | ICD-10-CM | POA: Insufficient documentation

## 2023-05-16 DIAGNOSIS — T50905A Adverse effect of unspecified drugs, medicaments and biological substances, initial encounter: Secondary | ICD-10-CM

## 2023-05-16 NOTE — ED Provider Notes (Signed)
[place] EMERGENCY DEPARTMENT AT [facility] Provider Note   CSN: [id number] Arrival date & time: 05/16/23  0701     History  Chief Complaint  Patient presents with   Body Twitching    Taylor Stevens is a 32 y.o. female w/ hx of myokymia, GAD that p/w twitching.  Went to an ED 8/3 for stomach ache, treated for acid reflux, didn't improve, then went back to another ED 8/6, CT showed colitis, got bentyl. Since taking the Bentyl, she has felt shaky, dizzy, sleepy, and nervous. 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 She feels like her twitching has gotten worse since taking the Bentyl. She went back to ED, was advised to stop taking the bentyl, but all last night she was shaking and feels like her nerves are jumping so she came back in today. She also started taking OTC Ollie stress gummies 2wks ago for stress (which appear to be a supplement containing GABA, Ltheanine, and lemon balm).   She feels like her abm discomfort is now improving too, and she mainly feels gassy. She is not eating as much, but is drinking 2 bottles of water a day.       Home Medications Prior to Admission medications   Medication Sig Start Date End Date Taking? Authorizing Provider  ASHWAGANDHA PO Take 1 tablet by mouth daily.    [provider]  diphenhydrAMINE (BENADRYL) 25 MG tablet Take 1 tablet (25 mg total) by mouth every 6 (six) hours. 03/27/17 03/12/22  Shaune Pollack, MD  docusate sodium (COLACE) 100 MG capsule Take 1 capsule (100 mg total) by mouth every 12 (twelve) hours. Patient not taking: Reported on 08/22/2021 05/15/17   Arby Barrette, MD  hydrOXYzine (ATARAX/VISTARIL) 25 MG tablet Take 1 tablet (25 mg total) by mouth at bedtime as needed. Patient not taking: Reported on 08/22/2021 11/08/19   Army Melia A, PA-C  Multiple Vitamins-Minerals (MULTIVITAMIN WITH MINERALS) tablet Take 1 tablet by mouth daily.     [provider]  omeprazole (PRILOSEC) 20 MG capsule Take 1 capsule (20 mg total) by mouth daily. 08/26/22   Theron Arista, PA-C  ondansetron (ZOFRAN) 4 MG tablet Take 1 tablet (4 mg total) by mouth every 8 (eight) hours as needed for up to 20 doses for nausea or vomiting. 05/09/23   Arletha Pili, DO  Pseudoeph-Doxylamine-DM-APAP (NYQUIL PO) Take 15 mLs by mouth daily as needed (headache).    [provider]  sertraline (ZOLOFT) 50 MG tablet  05/04/23   [provider]  sucralfate (CARAFATE) 1 g tablet Take 1 tablet (1 g total) by mouth 4 (four) times daily -  with meals and at bedtime. 05/06/23   Gwyneth Sprout, MD      Allergies    Shellfish allergy    Review of Systems   Review of Systems  Physical Exam Updated Vital Signs BP 119/81 (BP Location: Right Arm)   Pulse 83   Temp 98.3 F (36.8 C) (Oral)   Resp 16   Ht 5\' 3"  (1.6 m)   Wt 77.1 kg   SpO2 98%   BMI 30.11 kg/m  Physical Exam Gen: Alert, well-appearing, pleasant woman. NAD Neuro: CN 2-12 intact, 5/5 and symmetric strength and sensation in BL UE and LE.   CV: RRR, no murmur Resp: CTAB, no crackles or wheeze. Normal WOB on RA. Psych: Mood and affect appropriate.  ED Results / Procedures / Treatments   Labs (  all labs ordered are listed, but only abnormal results are displayed) Labs Reviewed - No data to display  EKG None  Radiology No results found.  Procedures Procedures    Medications Ordered in ED Medications - No data to display  ED Course/ Medical Decision Making/ A&P                                Medical Decision Making:   Taylor Stevens is a 32 y.o. female who presented to the ED today with twitching, anxiety after starting Bentyl detailed above.     Initial Assessment:   With the patient's presentation of dizziness, twitching, and anxiety after taking Bentyl, most likely diagnosis is anticholinergic effects of Bentyl. 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 Reassuringly, VSS and neuro exam wnl.   This is most consistent with an acute complicated illness  Initial Plan:  No new labs were indicated at this time. We observed pt and monitored her vitals.   DG Chest Port 1 View  Result Date: 05/11/2023 CLINICAL DATA:  Shortness of breath EXAM: PORTABLE CHEST 1 VIEW COMPARISON:  08/26/2022 FINDINGS: The heart size and mediastinal contours are within normal limits. Both lungs are clear. The visualized skeletal structures are unremarkable. IMPRESSION: No active disease. Electronically Signed   By: Charlett Nose M.D.   On: 05/11/2023 19:15   CT ABDOMEN PELVIS W CONTRAST  Result Date: 05/09/2023 CLINICAL DATA:  Six day history of abdominal pain associated with weakness and inability to tolerate p.o. EXAM: CT ABDOMEN AND PELVIS WITH CONTRAST TECHNIQUE: Multidetector CT imaging of the abdomen and pelvis was performed using the standard protocol following bolus administration of intravenous contrast. RADIATION DOSE REDUCTION: This exam was performed according to the departmental dose-optimization program which includes automated exposure control, adjustment of the mA and/or kV according to patient size and/or use of iterative reconstruction technique. CONTRAST:  75mL OMNIPAQUE IOHEXOL 300 MG/ML  SOLN COMPARISON:  None Available. FINDINGS: Lower chest: No focal consolidation or pulmonary nodule in the lung bases. No pleural effusion or pneumothorax demonstrated. Partially imaged heart size is normal. Hepatobiliary: Irregular hypoattenuating focus in segment 5 along the gallbladder fossa measures 1.7 x 1.3 cm (2:22), incompletely characterized. Subcentimeter hypodensity in segment 2 is too small to characterize. No intra or extrahepatic biliary ductal dilation. Normal gallbladder. Pancreas: No focal lesions or main ductal dilation. Spleen: Normal in size without focal abnormality.  Adrenals/Urinary Tract: No adrenal nodules. No suspicious renal mass, calculi or hydronephrosis. No focal bladder wall thickening. Stomach/Bowel: Normal appearance of the stomach. Diffuse mural thickening of the transverse colon extending into the proximal descending colon. No abnormal bowel dilation. Normal appendix. Vascular/Lymphatic: No significant vascular findings are present. No enlarged abdominal or pelvic lymph nodes. Reproductive: Intrauterine device in-situ. Simple-appearing right adnexal cyst measures 2.2 cm (2:63). No specific follow-up imaging recommended. No left adnexal mass. Other: No free fluid, fluid collection, or free air. Musculoskeletal: No acute or abnormal lytic or blastic osseous lesions. IMPRESSION: 1. Diffuse mural thickening of the transverse colon extending into the proximal descending colon, consistent with colitis, which may be infectious or inflammatory. 2. Irregular hypoattenuating focus in segment 5 of the liver along the gallbladder fossa measures 1.7 x 1.3 cm, incompletely characterized. Recommend further evaluation with nonemergent liver protocol MRI. Electronically Signed   By: Agustin Cree M.D.   On:  05/09/2023 10:04     Reassessment and Plan:   Pt vitals remained stable after observation. Pt was recommended to stop taking Bentyl and to f/u with her PCP in the next week.  Pt expressed agreement with this plan.   Final Clinical Impression(s) / ED Diagnoses Final diagnoses:  Muscle twitching    Rx / DC Orders ED Discharge Orders     None         Lincoln Brigham, MD 05/16/23 1004    Jacalyn Lefevre, MD 05/16/23 1245

## 2023-05-16 NOTE — ED Provider Notes (Signed)
[facility] AT [facility] Provider Note   CSN: [id number] Arrival date & time: 05/15/23  1922     History  Chief Complaint  Patient presents with   Medication Reaction    Taylor Stevens is a 32 y.o. female.  HPI   32 year old female presenting to the emergency department due to concern for potential medication side effect.  The patient states that she has chronic twitching for which she has been worked up outpatient with her PCP and neurology.  She has had an extensive outpatient workup for this and no cause has been found.  She states that she was prescribed Bentyl after an ER visit on 8/6 for what was found to be a colitis on CT.  She feels that her abdominal pain has since resolved.  She feels that the Bentyl medication is causing her to have a reaction.  She feels like her chronic twitching is slightly worse when she is taking the medication.  She denies any new fevers, chills, new abdominal pain, chest pain or shortness of breath.  Home Medications Prior to Admission medications   Medication Sig Start Date End Date Taking? Authorizing Provider  ASHWAGANDHA PO Take 1 tablet by mouth daily.    [provider]  dicyclomine (BENTYL) 20 MG tablet Take 1 tablet (20 mg total) by mouth 2 (two) times daily. 05/09/23   Arletha Pili, DO  diphenhydrAMINE (BENADRYL) 25 MG tablet Take 1 tablet (25 mg total) by mouth every 6 (six) hours. 03/27/17 03/12/22  Shaune Pollack, MD  docusate sodium (COLACE) 100 MG capsule Take 1 capsule (100 mg total) by mouth every 12 (twelve) hours. Patient not taking: Reported on 08/22/2021 05/15/17   Arby Barrette, MD  hydrOXYzine (ATARAX/VISTARIL) 25 MG tablet Take 1 tablet (25 mg total) by mouth at bedtime as needed. Patient not taking: Reported on 08/22/2021 11/08/19   Army Melia A, PA-C  Multiple Vitamins-Minerals (MULTIVITAMIN WITH MINERALS) tablet Take 1 tablet by mouth daily.    [provider]   omeprazole (PRILOSEC) 20 MG capsule Take 1 capsule (20 mg total) by mouth daily. 08/26/22   Theron Arista, PA-C  ondansetron (ZOFRAN) 4 MG tablet Take 1 tablet (4 mg total) by mouth every 8 (eight) hours as needed for up to 20 doses for nausea or vomiting. 05/09/23   Arletha Pili, DO  Pseudoeph-Doxylamine-DM-APAP (NYQUIL PO) Take 15 mLs by mouth daily as needed (headache).    [provider]  sertraline (ZOLOFT) 50 MG tablet  05/04/23   [provider]  sucralfate (CARAFATE) 1 g tablet Take 1 tablet (1 g total) by mouth 4 (four) times daily -  with meals and at bedtime. 05/06/23   Gwyneth Sprout, MD      Allergies    Shellfish allergy    Review of Systems   Review of Systems  All other systems reviewed and are negative.   Physical Exam Updated Vital Signs BP 106/76 (BP Location: Left Arm)   Pulse 85   Temp 98.2 F (36.8 C)   Resp 16   SpO2 100%  Physical Exam Vitals and nursing note reviewed.  Constitutional:      General: She is not in acute distress.    Appearance: She is well-developed.  HENT:     Head: Normocephalic and atraumatic.  Eyes:     Conjunctiva/sclera: Conjunctivae normal.  Cardiovascular:     Rate and Rhythm: Normal rate and regular rhythm.  Pulmonary:     Effort: Pulmonary  effort is normal. No respiratory distress.     Breath sounds: Normal breath sounds.  Abdominal:     Palpations: Abdomen is soft.     Tenderness: There is no abdominal tenderness.  Musculoskeletal:        General: No swelling.     Cervical back: Neck supple.  Skin:    General: Skin is warm and dry.     Capillary Refill: Capillary refill takes less than 2 seconds.  Neurological:     Mental Status: She is alert.     Comments: MENTAL STATUS EXAM:    Orientation: Alert and oriented to person, place and time.  Memory: Cooperative, follows commands well.  Language: Speech is clear and language is normal.   CRANIAL NERVES:    CN 2 (Optic): Visual fields intact to  confrontation.  CN 3,4,6 (EOM): Pupils equal and reactive to light. Full extraocular eye movement without nystagmus.  CN 5 (Trigeminal): Facial sensation is normal, no weakness of masticatory muscles.  CN 7 (Facial): No facial weakness or asymmetry.  CN 8 (Auditory): Auditory acuity grossly normal.  CN 9,10 (Glossophar): The uvula is midline, the palate elevates symmetrically.  CN 11 (spinal access): Normal sternocleidomastoid and trapezius strength.  CN 12 (Hypoglossal): The tongue is midline. No atrophy or fasciculations.Marland Kitchen   MOTOR:  Muscle Strength: 5/5RUE, 5/5LUE, 5/5RLE, 5/5LLE.   COORDINATION:   no tremor.   SENSATION:   Intact to light touch all four extremities.  GAIT: Gait normal without ataxia   Psychiatric:        Mood and Affect: Mood normal.     ED Results / Procedures / Treatments   Labs (all labs ordered are listed, but only abnormal results are displayed) Labs Reviewed - No data to display  EKG None  Radiology No results found.  Procedures Procedures    Medications Ordered in ED Medications - No data to display  ED Course/ Medical Decision Making/ A&P                                 Medical Decision Making   32 year old female presenting to the emergency department due to concern for potential medication side effect.  The patient states that she has chronic twitching for which she has been worked up outpatient with her PCP and neurology.  She has had an extensive outpatient workup for this and no cause has been found.  She states that she was prescribed Bentyl after an ER visit on 8/6 for what was found to be a colitis on CT.  She feels that her abdominal pain has since resolved.  She feels that the Bentyl medication is causing her to have a reaction.  She feels like her chronic twitching is slightly worse when she is taking the medication.  She denies any new fevers, chills, new abdominal pain, chest pain or shortness of breath.  Arrival, the patient  was vitally stable.  She is overall well-appearing and has no new pain complaints.  She is worried about a medication side effect.  I advised that she stop taking the Bentyl.  Her abdominal exam is benign.  Suspect her colitis has resolved.  I did review her recent labs and she appears to have mildly low calcium.  I encouraged calcium supplementation and close follow-up with a primary care provider for recheck of her labs and continued outpatient management of as these symptoms are chronic.  She has no new  complaints at this time, overall stable for discharge.  Final Clinical Impression(s) / ED Diagnoses Final diagnoses:  Adverse effect of drug, initial encounter  Hypocalcemia    Rx / DC Orders ED Discharge Orders     None         Ernie Avena, MD 05/16/23 0107

## 2023-05-16 NOTE — ED Notes (Signed)
VSS completed, EKG preformed and passed to provider. Pt refusing any further work-up at this time. Pt seen leaving triage room to the lobby.

## 2023-05-16 NOTE — ED Notes (Signed)
Pt name called x2 in lobby with no answer

## 2023-05-16 NOTE — ED Triage Notes (Signed)
Pt came in via POV d/t an original stomach ache & vomiting that started on Aug 3rd, she came here to ED & was given Acid Reflux meds & d/c. Two days later she reports s/s worsened & she went to another ED & was given Bentyl after CT showed colitis (per pt). She reports since starting that new med she felt like her body has been jumping like its an "electric shock" jerk in her arms & legs, more so felt when she lays flat. She reports going back to same ED that prescribed Bentyl & they advised her to stop that med d/t her side effects & since then she has felt shivers, arms & legs still jumping & a "twitch" she feels in her mid section. Denies actual pain but endorses 10/10 level of discomfort & fear of what is going on in her body. A/Ox4, denies anymore vomiting.

## 2023-05-16 NOTE — ED Triage Notes (Signed)
Pt presents to the er with heart palpitations that started 2 days ago. Pt stated she was seen at the urgent care today and was told to come here for  "stated" elevated heart rate. Pt denies chest pain but reports "heaviness" to her left shoulder.

## 2023-05-16 NOTE — Discharge Instructions (Addendum)
Your symptoms are most likely from side effects of the Bentyl.  - Please stop taking Bentyl. The medications effects should wear off in the next few days. - Continue to drink water and keep yourself hydrated.  - You should follow-up with your primary care physician in the next week.

## 2023-05-16 NOTE — Discharge Instructions (Addendum)
As you had worsening symptoms of occasional body twitches/spasms, recommend you stop taking the Bentyl.  Your abdominal exam was reassuring as was the rest your physical exam and neurologic exam.  On your recent labs, your calcium was mildly low.  Can trial a supplementation of oral calcium via Tums and follow-up with a PCP for recheck of your labs and further continued evaluation.

## 2023-05-18 ENCOUNTER — Ambulatory Visit
Admission: EM | Admit: 2023-05-18 | Discharge: 2023-05-18 | Disposition: A | Discharge status: Home / Self Care | Payer: BC Managed Care – PPO | Attending: Internal Medicine | Admitting: Internal Medicine

## 2023-05-18 DIAGNOSIS — F419 Anxiety disorder, unspecified: Secondary | ICD-10-CM | POA: Diagnosis not present

## 2023-05-18 NOTE — ED Provider Notes (Signed)
[facility]    CSN: [id number] Arrival date & time: 05/18/23  1749      History   Chief Complaint Chief Complaint  Patient presents with   Fatigue    HPI Taylor Stevens is a 32 y.o. female presents for reassurance.  Patient was seen in the emergency room on 2 different times in August 13 for presumed adverse effect from Bentyl.  She was placed on this on August 3 for colitis.  Symptoms included some muscle twitching.  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  Her blood work was reassuring at these encounters.  She was instructed to stop the Bentyl and follow-up with her PCP.  She states her last dose of Bentyl was close to 48 hours ago.  She continues to have random muscle twitching and was becoming anxious about this and wanted to be sure she was okay.  She reports she is very tired but thinks it is because of everything going on recently she has not been eating as much recently as well.  Denies any headaches, chest pain, dizziness, syncope.  She does currently have a PCP.  No other concerns at this time.  HPI  Past Medical History:  Diagnosis Date   GERD (gastroesophageal reflux disease)    Guillain Barr syndrome (HCC)     There are no problems to display for this patient.   History reviewed. No pertinent surgical history.  OB History   No obstetric history on file.      Home Medications    Prior to Admission medications   Medication Sig Start Date End Date Taking? Authorizing Provider  ASHWAGANDHA PO Take 1 tablet by mouth daily.    [provider]  diphenhydrAMINE (BENADRYL) 25 MG tablet Take 1 tablet (25 mg total) by mouth every 6 (six) hours. 03/27/17 03/12/22  Shaune Pollack, MD  docusate sodium (COLACE) 100 MG capsule Take 1 capsule (100 mg total) by mouth every 12 (twelve) hours. Patient not taking: Reported on 08/22/2021 05/15/17   Arby Barrette, MD  hydrOXYzine (ATARAX/VISTARIL) 25 MG tablet Take 1 tablet (25 mg  total) by mouth at bedtime as needed. Patient not taking: Reported on 08/22/2021 11/08/19   Army Melia A, PA-C  Multiple Vitamins-Minerals (MULTIVITAMIN WITH MINERALS) tablet Take 1 tablet by mouth daily.    [provider]  omeprazole (PRILOSEC) 20 MG capsule Take 1 capsule (20 mg total) by mouth daily. 08/26/22   Theron Arista, PA-C  ondansetron (ZOFRAN) 4 MG tablet Take 1 tablet (4 mg total) by mouth every 8 (eight) hours as needed for up to 20 doses for nausea or vomiting. 05/09/23   Arletha Pili, DO  Pseudoeph-Doxylamine-DM-APAP (NYQUIL PO) Take 15 mLs by mouth daily as needed (headache).    [provider]  sertraline (ZOLOFT) 50 MG tablet  05/04/23   [provider]  sucralfate (CARAFATE) 1 g tablet Take 1 tablet (1 g total) by mouth 4 (four) times daily -  with meals and at bedtime. 05/06/23   Gwyneth Sprout, MD    Family History Family History  Problem Relation Age of Onset   CAD Mother     Social History Social History   Tobacco Use   Smoking status: Never   Smokeless tobacco: Never  Vaping Use   Vaping status: Never Used  Substance Use Topics   Alcohol use: Yes    Comment: occasionally   Drug use: No     Allergies  Shellfish allergy   Review of Systems Review of Systems  Psychiatric/Behavioral:  The patient is nervous/anxious.      Physical Exam Triage Vital Signs ED Triage Vitals  Encounter Vitals Group     BP 05/18/23 1800 114/72     Systolic BP Percentile --      Diastolic BP Percentile --      Pulse Rate 05/18/23 1800 99     Resp 05/18/23 1800 17     Temp 05/18/23 1800 98.6 F (37 C)     Temp Source 05/18/23 1800 Oral     SpO2 05/18/23 1800 97 %     Weight --      Height --      Head Circumference --      Peak Flow --      Pain Score 05/18/23 1758 0     Pain Loc --      Pain Education --      Exclude from Growth Chart --    No data found.  Updated Vital Signs BP 114/72 (BP Location: Left Arm)   Pulse 99    Temp 98.6 F (37 C) (Oral)   Resp 17   LMP 05/09/2023 (Exact Date)   SpO2 97%   Visual Acuity Right Eye Distance:   Left Eye Distance:   Bilateral Distance:    Right Eye Near:   Left Eye Near:    Bilateral Near:     Physical Exam Vitals and nursing note reviewed.  Constitutional:      General: She is not in acute distress.    Appearance: Normal appearance. She is not ill-appearing.  HENT:     Head: Normocephalic and atraumatic.  Eyes:     Pupils: Pupils are equal, round, and reactive to light.  Cardiovascular:     Rate and Rhythm: Normal rate.  Pulmonary:     Effort: Pulmonary effort is normal.  Skin:    General: Skin is warm and dry.  Neurological:     General: No focal deficit present.     Mental Status: She is alert and oriented to person, place, and time.  Psychiatric:        Thought Content: Thought content normal.        Judgment: Judgment normal.     Comments: Mildly anxious      UC Treatments / Results  Labs (all labs ordered are listed, but only abnormal results are displayed) Labs Reviewed - No data to display  EKG   Radiology No results found.  Procedures Procedures (including critical care time)  Medications Ordered in UC Medications - No data to display  Initial Impression / Assessment and Plan / UC Course  I have reviewed the triage vital signs and the nursing notes.  Pertinent labs & imaging results that were available during my care of the patient were reviewed by me and considered in my medical decision making (see chart for details).     Patient presents primarily for reassurance given her recent ER visits for medication reaction.  States her symptoms are improving but she continues to have some muscle twitching.  Reassured patient that her lab work from her ER visit 2 days ago was reassuring and as her symptoms are improving it is reasonable to continue to monitor at home and follow-up with her PCP if they do not continue to improve  and or they begin to worsen.  Discussed importance of rest, stress management, nutrition and hydration.  Work note was provided.  Patient states she feels better after encounter and will follow-up with her PCP as needed. Final Clinical Impressions(s) / UC Diagnoses   Final diagnoses:  Anxiety     Discharge Instructions      Please follow-up with your PCP if your symptoms do not continue to improve.  Get some rest, stay hydrated and eat some nutritious foods.    ED Prescriptions   None    PDMP not reviewed this encounter.   Radford Pax, NP 05/18/23 [phone]

## 2023-05-18 NOTE — Discharge Instructions (Signed)
Please follow-up with your PCP if your symptoms do not continue to improve.  Get some rest, stay hydrated and eat some nutritious foods.

## 2023-05-18 NOTE — ED Triage Notes (Signed)
Pt presents with c/o fatigue and nervousness X 2 wks.   States she has been in and out of the hospital for 2 wks. Has twitching in her legs and arms.

## 2023-05-29 ENCOUNTER — Ambulatory Visit (INDEPENDENT_AMBULATORY_CARE_PROVIDER_SITE_OTHER): Payer: BC Managed Care – PPO | Admitting: Gastroenterology

## 2023-05-29 ENCOUNTER — Other Ambulatory Visit (INDEPENDENT_AMBULATORY_CARE_PROVIDER_SITE_OTHER): Payer: BC Managed Care – PPO

## 2023-05-29 ENCOUNTER — Encounter: Payer: Self-pay | Admitting: Gastroenterology

## 2023-05-29 VITALS — BP 116/80 | HR 61 | Ht 63.0 in | Wt 156.0 lb

## 2023-05-29 DIAGNOSIS — K529 Noninfective gastroenteritis and colitis, unspecified: Secondary | ICD-10-CM

## 2023-05-29 DIAGNOSIS — R935 Abnormal findings on diagnostic imaging of other abdominal regions, including retroperitoneum: Secondary | ICD-10-CM

## 2023-05-29 LAB — C-REACTIVE PROTEIN: CRP: <1 mg/dL (ref 0.5–20.0)

## 2023-05-29 LAB — SEDIMENTATION RATE: Sed Rate: 6 mm/hr (ref 0–20)

## 2023-05-29 NOTE — Patient Instructions (Signed)
Your provider has requested that you go to the basement level for lab work before leaving today. Press "B" on the elevator. The lab is located at the first door on the left as you exit the elevator.  Due to recent changes in healthcare laws, you may see the results of your imaging and laboratory studies on MyChart before your provider has had a chance to review them.  We understand that in some cases there may be results that are confusing or concerning to you. Not all laboratory results come back in the same time frame and the provider may be waiting for multiple results in order to interpret others.  Please give [place] 48 hours in order for your provider to thoroughly review all the results before contacting the office for clarification of your results.   We are providing you with IBgard samples to use. This is over the counter.  You have been scheduled for an MRI at [facility] on 06/03/2023. Your appointment time is 4:00pm. Please arrive to ED 30 minutes prior to your appointment time for registration purposes. Please make certain not to have anything to eat or drink 4 hours prior to your test. In addition, if you have any metal in your body, have a pacemaker or defibrillator, please be sure to let your ordering physician know. This test typically takes 45 minutes to 1 hour to complete. Should you need to reschedule, please call [phone] to do so.   I appreciate the opportunity to care for you. Boone Master, PA-C

## 2023-05-29 NOTE — Progress Notes (Signed)
Chief Complaint: Hospital follow up Primary GI MD: Gentry Fitz  HPI: 32 year old female with medical history as listed below presents for evaluation of colitis.  Patient has had multiple visits to the emergency department this month for various ailments including abdominal pain, adverse reaction to Bentyl (dizziness), muscle twitching, and anxiety.  She was seen in the [facility] emergency department 05/09/2023 for epigastric pain and nausea that occurred soon after eating a [employer] biscuit.  She had been having similar symptoms prior to this visit and was treated with antacids without improvement.  CT abdomen pelvis with contrast 05/09/2023 showed diffuse mural thickening of the transverse colon extending into the proximal descending colon consistent with colitis (infectious versus inflammatory).  It also showed a hypoattenuating focus in the liver along the gallbladder fossa measuring 1.7 x 1.3 cm (incompletely characterized).  Recommended to follow-up with nonemergent liver protocol MRI  CBC and CMP have been unrevealing  Patient states that was her first time having any episode of pain like that.  States her bowel movements have been normal and she did not have diarrhea.  She does report she had food poisoning a month prior to her emergency department visit.  She states since her visit to the emergency department she has been doing well without pain.  Having soft formed bowel movements without melena or hematochezia.  She does often hear her bowel sounds which concerns her.  She also states "she feels twitches throughout her limbs and she is concerned this could be gas pain".  She is concerned about being put to sleep for any type of procedures and would like to avoid if possible.  Denies family history of colon cancer.  Denies GERD.  Past Medical History:  Diagnosis Date   Colitis    GAD (generalized anxiety disorder)    GERD (gastroesophageal reflux disease)    Guillain Barr syndrome  (HCC)    Thyroid nodule     No past surgical history on file.  Current Outpatient Medications  Medication Sig Dispense Refill   ASHWAGANDHA PO Take 1 tablet by mouth daily.     diphenhydrAMINE (BENADRYL) 25 MG tablet Take 1 tablet (25 mg total) by mouth every 6 (six) hours. 20 tablet 0   docusate sodium (COLACE) 100 MG capsule Take 1 capsule (100 mg total) by mouth every 12 (twelve) hours. (Patient not taking: Reported on 08/22/2021) 60 capsule 0   hydrOXYzine (ATARAX/VISTARIL) 25 MG tablet Take 1 tablet (25 mg total) by mouth at bedtime as needed. (Patient not taking: Reported on 08/22/2021) 12 tablet 0   Multiple Vitamins-Minerals (MULTIVITAMIN WITH MINERALS) tablet Take 1 tablet by mouth daily.     omeprazole (PRILOSEC) 20 MG capsule Take 1 capsule (20 mg total) by mouth daily. 30 capsule 1   ondansetron (ZOFRAN) 4 MG tablet Take 1 tablet (4 mg total) by mouth every 8 (eight) hours as needed for up to 20 doses for nausea or vomiting.     Pseudoeph-Doxylamine-DM-APAP (NYQUIL PO) Take 15 mLs by mouth daily as needed (headache).     sertraline (ZOLOFT) 50 MG tablet      sucralfate (CARAFATE) 1 g tablet Take 1 tablet (1 g total) by mouth 4 (four) times daily -  with meals and at bedtime. 90 tablet 1   No current facility-administered medications for this visit.    Allergies as of 05/29/2023 - Review Complete 05/18/2023  Allergen Reaction Noted   Shellfish allergy Anaphylaxis 03/26/2017    Family History  Problem Relation Age of Onset  CAD Mother     Social History   Socioeconomic History   Marital status: Single    Spouse name: Not on file   Number of children: Not on file   Years of education: Not on file   Highest education level: Not on file  Occupational History   Not on file  Tobacco Use   Smoking status: Never   Smokeless tobacco: Never  Vaping Use   Vaping status: Never Used  Substance and Sexual Activity   Alcohol use: Yes    Comment: occasionally   Drug use:  No   Sexual activity: Not on file  Other Topics Concern   Not on file  Social History Narrative   Not on file   Social Determinants of Health   Financial Resource Strain: Not on file  Food Insecurity: Not on file  Transportation Needs: Not on file  Physical Activity: Not on file  Stress: Not on file  Social Connections: Not on file  Intimate Partner Violence: Not on file    Review of Systems:    Constitutional: No weight loss, fever, chills, weakness or fatigue HEENT: Eyes: No change in vision               Ears, Nose, Throat:  No change in hearing or congestion Skin: No rash or itching Cardiovascular: No chest pain, chest pressure or palpitations   Respiratory: No SOB or cough Gastrointestinal: See HPI and otherwise negative Genitourinary: No dysuria or change in urinary frequency Neurological: No headache, dizziness or syncope Musculoskeletal: No new muscle or joint pain Hematologic: No bleeding or bruising Psychiatric: No history of depression or anxiety    Physical Exam:  Vital signs: LMP 05/09/2023 (Exact Date)   Constitutional: NAD, Well developed, Well nourished, alert and cooperative Head:  Normocephalic and atraumatic. Eyes:   PEERL, EOMI. No icterus. Conjunctiva pink. Respiratory: Respirations even and unlabored. Lungs clear to auscultation bilaterally.   No wheezes, crackles, or rhonchi.  Cardiovascular:  Regular rate and rhythm. No peripheral edema, cyanosis or pallor.  Gastrointestinal:  Soft, nondistended, nontender. No rebound or guarding. Normal bowel sounds. No appreciable masses or hepatomegaly. Rectal:  Not performed.  Msk:  Symmetrical without gross deformities. Without edema, no deformity or joint abnormality.  Neurologic:  Alert and  oriented x4;  grossly normal neurologically.  Skin:   Dry and intact without significant lesions or rashes. Psychiatric: Oriented to person, place and time. Demonstrates good judgement and reason without abnormal  affect or behaviors.   RELEVANT LABS AND IMAGING: CBC    Component Value Date/Time   WBC 6.2 05/09/2023 0900   RBC 4.04 05/09/2023 0900   HGB 13.1 05/09/2023 0900   HCT 37.7 05/09/2023 0900   PLT 226 05/09/2023 0900   MCV 93.3 05/09/2023 0900   MCH 32.4 05/09/2023 0900   MCHC 34.7 05/09/2023 0900   RDW 11.1 (L) 05/09/2023 0900   LYMPHSABS 2.0 05/09/2023 0900   MONOABS 0.3 05/09/2023 0900   EOSABS 0.1 05/09/2023 0900   BASOSABS 0.0 05/09/2023 0900    CMP     Component Value Date/Time   NA 133 (L) 05/09/2023 0900   K 3.9 05/09/2023 0900   CL 103 05/09/2023 0900   CO2 20 (L) 05/09/2023 0900   GLUCOSE 117 (H) 05/09/2023 0900   BUN 11 05/09/2023 0900   CREATININE 0.73 05/09/2023 0900   CALCIUM 8.7 (L) 05/09/2023 0900   PROT 7.3 05/09/2023 0900   ALBUMIN 4.0 05/09/2023 0900   AST 17 05/09/2023 0900  ALT 9 05/09/2023 0900   ALKPHOS 30 (L) 05/09/2023 0900   BILITOT 0.9 05/09/2023 0900   GFRNONAA >60 05/09/2023 0900   GFRAA >60 01/20/2020 0533     Assessment/Plan:   32 year old female presenting for hospital follow-up in which she had epigastric pain and nausea without improvement on PPI and CT scan showing infectious versus inflammatory colitis of the transverse and proximal descending colon.  Her symptoms have since resolved.  No change in bowel movements.  No melena or hematochezia.  Could be sequelae from her previous history of food poisoning a month prior(?)  Abnormal CT scan With CT scan showing abnormality along the gallbladder fossa we will get MRI with liver protocol for further evaluation.  Reassuring normal LFTs --- MRI liver protocol  Colitis Discussed possibility of having colonoscopy with biopsies to evaluate vs conservative management. Since no history of diarrhea, unable to r/o infectious etiology with stool studies and less likely. Patient would like to avoid colonoscopy if possible. --- CRP --- Fecal calprotectin --- If the above are negative, less  inclined to proceed with colonoscopy.  If the above are elevated would recommend colonoscopy to rule out IBD.  Lara Mulch [facility] 05/29/2023, 2:05 PM  Cc: [facility]

## 2023-05-30 ENCOUNTER — Encounter: Payer: Self-pay | Admitting: *Deleted

## 2023-05-30 ENCOUNTER — Telehealth: Payer: Self-pay | Admitting: Gastroenterology

## 2023-05-30 NOTE — Telephone Encounter (Signed)
Patient was seem by Boone Master, PA-C yesterday in the office for abnormal CT scan suggesting colitis (infectious vs inflammatory).  She states that she over the last couple of days, she has had nausea with eating and eats only a small amount before becoming full. Denies any vomiting. Patient asks for medication to help nausea. Patient medication list indicates that she was given ondansetron 4 mg tablets from the hospital. Patient states that she still has this on hand and it was helpful previously. Advised patient she may use this 1 tablet every 8 hours as needed for nausea (as previously prescribed). Additionally, patient states that she continues to have "gurgling, sloshing" sounds in her abdomen. Was given IBGard for this yesterday but has not taken. Advised she try IBGard and if effective, purchase additional IBGard OTC for this. She verbalizes understanding.

## 2023-05-30 NOTE — Telephone Encounter (Signed)
PT is experiencing a lot of nausea. She doesn't know if this is happening due to her neurology issues. She's having a lot of muscle spams. She wants to know can something be sent to pharmacy to help soothe it. Send script to [facility] on [address].

## 2023-05-30 NOTE — Progress Notes (Signed)
Addendum: Reviewed and agree with assessment and management plan. Pyrtle, Jay M, MD  

## 2023-06-02 ENCOUNTER — Other Ambulatory Visit: Payer: BC Managed Care – PPO

## 2023-06-02 DIAGNOSIS — K529 Noninfective gastroenteritis and colitis, unspecified: Secondary | ICD-10-CM

## 2023-06-02 DIAGNOSIS — R935 Abnormal findings on diagnostic imaging of other abdominal regions, including retroperitoneum: Secondary | ICD-10-CM

## 2023-06-03 ENCOUNTER — Ambulatory Visit (HOSPITAL_COMMUNITY): Admission: RE | Admit: 2023-06-03 | Payer: BC Managed Care – PPO | Source: Ambulatory Visit

## 2023-06-03 ENCOUNTER — Encounter (HOSPITAL_COMMUNITY): Payer: Self-pay

## 2023-06-03 ENCOUNTER — Ambulatory Visit (HOSPITAL_COMMUNITY)
Admission: RE | Admit: 2023-06-03 | Discharge: 2023-06-03 | Disposition: A | Discharge status: Home / Self Care | Payer: BC Managed Care – PPO | Source: Ambulatory Visit | Attending: Gastroenterology | Admitting: Gastroenterology

## 2023-06-03 DIAGNOSIS — K529 Noninfective gastroenteritis and colitis, unspecified: Secondary | ICD-10-CM

## 2023-06-03 DIAGNOSIS — R935 Abnormal findings on diagnostic imaging of other abdominal regions, including retroperitoneum: Secondary | ICD-10-CM | POA: Diagnosis not present

## 2023-06-03 MED ORDER — GADOBUTROL 1 MMOL/ML IV SOLN
6.0000 mL | Freq: Once | INTRAVENOUS | Status: AC | PRN
  Administered 2023-06-03: 6 mL via INTRAVENOUS

## 2023-06-06 LAB — CALPROTECTIN, FECAL: Calprotectin, Fecal: 52 ug/g (ref 0–120)

## 2023-06-27 ENCOUNTER — Emergency Department (HOSPITAL_BASED_OUTPATIENT_CLINIC_OR_DEPARTMENT_OTHER)
Admission: EM | Admit: 2023-06-27 | Discharge: 2023-06-27 | Disposition: A | Discharge status: Home / Self Care | Payer: BC Managed Care – PPO | Attending: Emergency Medicine | Admitting: Emergency Medicine

## 2023-06-27 ENCOUNTER — Encounter (HOSPITAL_BASED_OUTPATIENT_CLINIC_OR_DEPARTMENT_OTHER): Payer: Self-pay | Admitting: Pediatrics

## 2023-06-27 ENCOUNTER — Other Ambulatory Visit: Payer: Self-pay

## 2023-06-27 DIAGNOSIS — R051 Acute cough: Secondary | ICD-10-CM

## 2023-06-27 DIAGNOSIS — J189 Pneumonia, unspecified organism: Secondary | ICD-10-CM | POA: Diagnosis not present

## 2023-06-27 DIAGNOSIS — Z1152 Encounter for screening for COVID-19: Secondary | ICD-10-CM | POA: Insufficient documentation

## 2023-06-27 LAB — RESP PANEL BY RT-PCR (RSV, FLU A&B, COVID)  RVPGX2
Influenza A by PCR: NEGATIVE
Influenza B by PCR: NEGATIVE
Resp Syncytial Virus by PCR: NEGATIVE
SARS Coronavirus 2 by RT PCR: NEGATIVE

## 2023-06-27 MED ORDER — DOXYCYCLINE HYCLATE 100 MG PO CAPS: 100.0000 mg | ORAL_CAPSULE | Freq: Two times a day (BID) | ORAL | 0 refills | Status: DC

## 2023-06-27 MED ORDER — HYDROCODONE BIT-HOMATROP MBR 5-1.5 MG/5ML PO SOLN: 5.0000 mL | Freq: Four times a day (QID) | ORAL | 0 refills | Status: DC | PRN

## 2023-06-27 MED ORDER — BENZONATATE 100 MG PO CAPS: 100.0000 mg | ORAL_CAPSULE | Freq: Three times a day (TID) | ORAL | 0 refills | Status: DC

## 2023-06-27 NOTE — ED Triage Notes (Signed)
C/O cough and chest congestion since Friday; states she was taking priolosec just in case it was acid reflux.

## 2023-06-27 NOTE — Discharge Instructions (Signed)
Your workup today was reassuring.  No evidence of COVID, flu, or RSV.  I have sent in a few medications into the pharmacy for you.  Including to cough medicines.  Hycodan is a cough syrup that has narcotic medicine.  It can make you drowsy.  Reserve this for bedtime for severe coughing spells.  For any concerning symptoms return to the emergency room otherwise follow-up with your primary care provider.

## 2023-06-27 NOTE — ED Provider Notes (Signed)
[place] [facility]Y DEPARTMENT AT [facility] Provider Note   CSN: [id number] Arrival date & time: 06/27/23  1731     History  Chief Complaint  Patient presents with   Cough    Taylor Stevens is a 32 y.o. female.  32 year old female presents today for concern of cough that has been worsening over the past week.  No shortness of breath, chest pain.  Cough is nonproductive.  Has not tried anything over-the-counter.  She has been taking Prilosec and has not helped.  Endorses congestion, and postnasal drainage.  The history is provided by the patient. No language interpreter was used.       Home Medications Prior to Admission medications   Medication Sig Start Date End Date Taking? Authorizing Provider  benzonatate (TESSALON) 100 MG capsule Take 1 capsule (100 mg total) by mouth every 8 (eight) hours. 06/27/23  Yes Cesareo Vickrey, PA-C  doxycycline (VIBRAMYCIN) 100 MG capsule Take 1 capsule (100 mg total) by mouth 2 (two) times daily. 06/27/23  Yes Tanikka Bresnan, PA-C  HYDROcodone bit-homatropine (HYCODAN) 5-1.5 MG/5ML syrup Take 5 mLs by mouth every 6 (six) hours as needed for cough. 06/27/23  Yes Everest Hacking, PA-C  ASHWAGANDHA PO Take 1 tablet by mouth daily.    [provider]  diphenhydrAMINE (BENADRYL) 25 MG tablet Take 1 tablet (25 mg total) by mouth every 6 (six) hours. 03/27/17 03/12/22  Shaune Pollack, MD  docusate sodium (COLACE) 100 MG capsule Take 1 capsule (100 mg total) by mouth every 12 (twelve) hours. 05/15/17   Arby Barrette, MD  hydrOXYzine (ATARAX/VISTARIL) 25 MG tablet Take 1 tablet (25 mg total) by mouth at bedtime as needed. 11/08/19   Jeannie Fend, PA-C  Multiple Vitamins-Minerals (MULTIVITAMIN WITH MINERALS) tablet Take 1 tablet by mouth daily.    [provider]  omeprazole (PRILOSEC) 20 MG capsule Take 1 capsule (20 mg total) by mouth daily. 08/26/22   Theron Arista, PA-C  ondansetron (ZOFRAN) 4 MG tablet Take 1 tablet (4 mg total) by  mouth every 8 (eight) hours as needed for up to 20 doses for nausea or vomiting. 05/09/23   Arletha Pili, DO  sertraline (ZOLOFT) 50 MG tablet  05/04/23   [provider]  sucralfate (CARAFATE) 1 g tablet Take 1 tablet (1 g total) by mouth 4 (four) times daily -  with meals and at bedtime. 05/06/23   Gwyneth Sprout, MD      Allergies    Influenza virus vaccine, Shellfish allergy, and Bentyl [dicyclomine]    Review of Systems   Review of Systems  Constitutional:  Negative for chills and fever.  HENT:  Positive for congestion and postnasal drip.   Respiratory:  Positive for cough. Negative for shortness of breath.   Cardiovascular:  Negative for chest pain.  Neurological:  Negative for light-headedness.  All other systems reviewed and are negative.   Physical Exam Updated Vital Signs BP 114/76 (BP Location: Left Arm)   Temp 98 F (36.7 C) (Oral)   Resp 18   Ht 5\' 3"  (1.6 m)   Wt 70.3 kg   LMP 06/25/2023 (Approximate)   SpO2 100%   BMI 27.46 kg/m  Physical Exam Vitals and nursing note reviewed.  Constitutional:      General: She is not in acute distress.    Appearance: Normal appearance. She is not ill-appearing.  HENT:     Head: Normocephalic and atraumatic.     Nose: Nose normal.  Eyes:  Conjunctiva/sclera: Conjunctivae normal.  Cardiovascular:     Rate and Rhythm: Normal rate and regular rhythm.     Pulses: Normal pulses.  Pulmonary:     Effort: Pulmonary effort is normal. No respiratory distress.     Breath sounds: Normal breath sounds. No wheezing.  Musculoskeletal:        General: No deformity. Normal range of motion.     Cervical back: Normal range of motion.  Skin:    Findings: No rash.  Neurological:     Mental Status: She is alert.     ED Results / Procedures / Treatments   Labs (all labs ordered are listed, but only abnormal results are displayed) Labs Reviewed  RESP PANEL BY RT-PCR (RSV, FLU A&B, COVID)  [id number]     EKG None  Radiology No results found.  Procedures Procedures    Medications Ordered in ED Medications - No data to display  ED Course/ Medical Decision Making/ A&P                                 Medical Decision Making Risk Prescription drug management.   32 year old female presents today for concern of cough that has been ongoing for the past week and progressively worsening.  No chest pain, shortness of breath.  SpO2 100% on room air.  She is concerned regarding atypical pneumonia.  Respiratory panel obtained and negative for COVID, flu, RSV.  Suspicion for pneumonia however given concern for atypical pneumonia significantly for pain radiograph will go ahead and empirically treat with doxycycline.  She is agreeable.  Will give Hycodan and Tessalon for cough suppressant purposes.  She is agreeable.  Discharged in stable condition.   Final Clinical Impression(s) / ED Diagnoses Final diagnoses:  Acute cough  Atypical pneumonia    Rx / DC Orders ED Discharge Orders          Ordered    doxycycline (VIBRAMYCIN) 100 MG capsule  2 times daily        06/27/23 1845    HYDROcodone bit-homatropine (HYCODAN) 5-1.5 MG/5ML syrup  Every 6 hours PRN        06/27/23 1845    benzonatate (TESSALON) 100 MG capsule  Every 8 hours        06/27/23 1845              Marita Kansas, PA-C 06/27/23 1848    Royanne Foots, DO 07/04/23 1405

## 2023-06-30 ENCOUNTER — Emergency Department (HOSPITAL_BASED_OUTPATIENT_CLINIC_OR_DEPARTMENT_OTHER)
Admission: EM | Admit: 2023-06-30 | Discharge: 2023-06-30 | Disposition: A | Discharge status: Home / Self Care | Payer: BC Managed Care – PPO | Attending: Emergency Medicine | Admitting: Emergency Medicine

## 2023-06-30 ENCOUNTER — Other Ambulatory Visit: Payer: Self-pay

## 2023-06-30 ENCOUNTER — Encounter (HOSPITAL_BASED_OUTPATIENT_CLINIC_OR_DEPARTMENT_OTHER): Payer: Self-pay

## 2023-06-30 DIAGNOSIS — E876 Hypokalemia: Secondary | ICD-10-CM | POA: Insufficient documentation

## 2023-06-30 DIAGNOSIS — M62838 Other muscle spasm: Secondary | ICD-10-CM | POA: Insufficient documentation

## 2023-06-30 LAB — CBC WITH DIFFERENTIAL/PLATELET
Abs Immature Granulocytes: 0.01 10*3/uL (ref 0.00–0.07)
Basophils Absolute: 0 10*3/uL (ref 0.0–0.1)
Basophils Relative: 0 %
Eosinophils Absolute: 0 10*3/uL (ref 0.0–0.5)
Eosinophils Relative: 1 %
HCT: 37.2 % (ref 36.0–46.0)
Hemoglobin: 13.2 g/dL (ref 12.0–15.0)
Immature Granulocytes: 0 %
Lymphocytes Relative: 34 %
Lymphs Abs: 1.8 10*3/uL (ref 0.7–4.0)
MCH: 33.5 pg (ref 26.0–34.0)
MCHC: 35.5 g/dL (ref 30.0–36.0)
MCV: 94.4 fL (ref 80.0–100.0)
Monocytes Absolute: 0.3 10*3/uL (ref 0.1–1.0)
Monocytes Relative: 5 %
Neutro Abs: 3.2 10*3/uL (ref 1.7–7.7)
Neutrophils Relative %: 60 %
Platelets: 222 10*3/uL (ref 150–400)
RBC: 3.94 MIL/uL (ref 3.87–5.11)
RDW: 11.7 % (ref 11.5–15.5)
WBC: 5.4 10*3/uL (ref 4.0–10.5)
nRBC: 0 % (ref 0.0–0.2)

## 2023-06-30 LAB — BASIC METABOLIC PANEL
Anion gap: 10 (ref 5–15)
BUN: 12 mg/dL (ref 6–20)
CO2: 23 mmol/L (ref 22–32)
Calcium: 8.9 mg/dL (ref 8.9–10.3)
Chloride: 104 mmol/L (ref 98–111)
Creatinine, Ser: 0.63 mg/dL (ref 0.44–1.00)
GFR, Estimated: >60 mL/min (ref >60)
Glucose, Bld: 82 mg/dL (ref 70–99)
Potassium: 3.4 mmol/L — ABNORMAL LOW (ref 3.5–5.1)
Sodium: 137 mmol/L (ref 135–145)

## 2023-06-30 LAB — MAGNESIUM: Magnesium: 2 mg/dL (ref 1.7–2.4)

## 2023-06-30 LAB — HCG, SERUM, QUALITATIVE: Preg, Serum: NEGATIVE

## 2023-06-30 MED ORDER — POTASSIUM CHLORIDE CRYS ER 20 MEQ PO TBCR
40.0000 meq | EXTENDED_RELEASE_TABLET | Freq: Once | ORAL | Status: AC
  Administered 2023-06-30: 40 meq via ORAL
  Filled 2023-06-30: qty 2

## 2023-06-30 NOTE — ED Provider Notes (Signed)
[place] EMERGENCY DEPARTMENT AT [facility] Provider Note   CSN: [id number] Arrival date & time: 06/30/23  1047     History  Chief Complaint  Patient presents with   Spasms    Taylor Stevens is a 32 y.o. female.  Patient here with some twitching in her legs, some muscle spasms.  She was have her blood work checked.  Symptoms now resolved.  She has history of the same but usually happens in her face and not much in her legs.  She is currently on some antibiotics and cough medicine.  She says those things have improved.  She denies any fever or sputum production.  No nausea vomiting diarrhea.  The history is provided by the patient.       Home Medications Prior to Admission medications   Medication Sig Start Date End Date Taking? Authorizing Provider  ASHWAGANDHA PO Take 1 tablet by mouth daily.    [provider]  benzonatate (TESSALON) 100 MG capsule Take 1 capsule (100 mg total) by mouth every 8 (eight) hours. 06/27/23   Marita Kansas, PA-C  diphenhydrAMINE (BENADRYL) 25 MG tablet Take 1 tablet (25 mg total) by mouth every 6 (six) hours. 03/27/17 03/12/22  Shaune Pollack, MD  docusate sodium (COLACE) 100 MG capsule Take 1 capsule (100 mg total) by mouth every 12 (twelve) hours. 05/15/17   Arby Barrette, MD  doxycycline (VIBRAMYCIN) 100 MG capsule Take 1 capsule (100 mg total) by mouth 2 (two) times daily. 06/27/23   Karie Mainland, Amjad, PA-C  HYDROcodone bit-homatropine (HYCODAN) 5-1.5 MG/5ML syrup Take 5 mLs by mouth every 6 (six) hours as needed for cough. 06/27/23   Marita Kansas, PA-C  hydrOXYzine (ATARAX/VISTARIL) 25 MG tablet Take 1 tablet (25 mg total) by mouth at bedtime as needed. 11/08/19   Jeannie Fend, PA-C  Multiple Vitamins-Minerals (MULTIVITAMIN WITH MINERALS) tablet Take 1 tablet by mouth daily.    [provider]  omeprazole (PRILOSEC) 20 MG capsule Take 1 capsule (20 mg total) by mouth daily. 08/26/22   Theron Arista, PA-C  ondansetron (ZOFRAN) 4 MG  tablet Take 1 tablet (4 mg total) by mouth every 8 (eight) hours as needed for up to 20 doses for nausea or vomiting. 05/09/23   Arletha Pili, DO  sertraline (ZOLOFT) 50 MG tablet  05/04/23   [provider]  sucralfate (CARAFATE) 1 g tablet Take 1 tablet (1 g total) by mouth 4 (four) times daily -  with meals and at bedtime. 05/06/23   Gwyneth Sprout, MD      Allergies    Influenza virus vaccine, Shellfish allergy, and Bentyl [dicyclomine]    Review of Systems   Review of Systems  Physical Exam Updated Vital Signs BP 118/87   Pulse 93   Temp 98.5 F (36.9 C)   Resp 20   Ht 5\' 3"  (1.6 m)   LMP 06/25/2023 (Approximate) Comment: pt recently had IUD removed, reports last menstrual ended 06/28/23  SpO2 100%   BMI 27.46 kg/m  Physical Exam Vitals and nursing note reviewed.  Constitutional:      General: She is not in acute distress.    Appearance: She is well-developed. She is not ill-appearing.  HENT:     Head: Normocephalic and atraumatic.     Nose: Nose normal.     Mouth/Throat:     Mouth: Mucous membranes are moist.  Eyes:     Extraocular Movements: Extraocular movements intact.     Conjunctiva/sclera: Conjunctivae normal.  Pupils: Pupils are equal, round, and reactive to light.  Cardiovascular:     Rate and Rhythm: Normal rate and regular rhythm.     Pulses: Normal pulses.     Heart sounds: Normal heart sounds. No murmur heard. Pulmonary:     Effort: Pulmonary effort is normal. No respiratory distress.     Breath sounds: Normal breath sounds.  Abdominal:     Palpations: Abdomen is soft.     Tenderness: There is no abdominal tenderness.  Musculoskeletal:        General: No swelling.     Cervical back: Normal range of motion and neck supple.  Skin:    General: Skin is warm and dry.     Capillary Refill: Capillary refill takes less than 2 seconds.  Neurological:     General: No focal deficit present.     Mental Status: She is alert and oriented to  person, place, and time.     Cranial Nerves: No cranial nerve deficit.     Sensory: No sensory deficit.     Motor: No weakness.     Coordination: Coordination normal.     Comments: -Onset of 5 strength, normal sensation, no drift, normal speech, normal visual fields  Psychiatric:        Mood and Affect: Mood normal.     ED Results / Procedures / Treatments   Labs (all labs ordered are listed, but only abnormal results are displayed) Labs Reviewed  BASIC METABOLIC PANEL - Abnormal; Notable for the following components:      Result Value   Potassium 3.4 (*)    All other components within normal limits  CBC WITH DIFFERENTIAL/PLATELET  MAGNESIUM  HCG, SERUM, QUALITATIVE    EKG None  Radiology No results found.  Procedures Procedures    Medications Ordered in ED Medications  potassium chloride SA (KLOR-CON M) CR tablet 40 mEq (has no administration in time range)    ED Course/ Medical Decision Making/ A&P                                 Medical Decision Making Amount and/or Complexity of Data Reviewed Labs: ordered.  Risk Prescription drug management.   Taylor Stevens is here with muscle spasms.  History of reflux, Guillain-Barr.  History of anxiety.  She is overall very well-appearing.  She is nervous about her electrolytes and blood counts.  Will check these things including CBC BMP magnesium.  She is very well-appearing.  Have no concern for seizures or other acute neurologic process.  She is not having any symptoms now.  She is currently on antibiotics for presumed pneumonia which she is feels like she is doing much better.  Cough is also improved.  Differential diagnosis likely electrolyte abnormality or stress related process.  Potassium 3.4 but otherwise lab work is unremarkable per my review interpretation.  Patient does have potassium.  Recommend continued oral repletion home with bananas and Gatorade.  Follow-up primary care doctor.  Discharged in good  condition.  This chart was dictated using voice recognition software.  Despite best efforts to proofread,  errors can occur which can change the documentation meaning.         Final Clinical Impression(s) / ED Diagnoses Final diagnoses:  Hypokalemia  Muscle spasm    Rx / DC Orders ED Discharge Orders     None         Alzina Golda, DO  06/30/23 1150  

## 2023-06-30 NOTE — ED Notes (Signed)
Pt reports recent removal of IUD. Pt stated she had a "heavier than normal" bleeding that lasted for 3-5 days. Pt is concerned her iron levels are low and that her reported twitching and muscle jerks/spasms are related to low iron levels.

## 2023-06-30 NOTE — ED Triage Notes (Signed)
Pt presents c/o twitching and hypnic jerks in legs and feet, requesting blood work.  Pt has hx of and has been seen recently for same. Pt ambulatory to room, CA&Ox4, and in NAD at time of triage.

## 2023-06-30 NOTE — Discharge Instructions (Signed)
Recommend to eat and foods with little bit of extra of potassium including bananas Gatorade's.  Overall your lab works unremarkable.  Follow-up with your primary care doctor.

## 2023-07-10 ENCOUNTER — Telehealth: Payer: Self-pay | Admitting: Gastroenterology

## 2023-07-10 NOTE — Telephone Encounter (Signed)
Yes, ok with me 

## 2023-07-10 NOTE — Telephone Encounter (Signed)
Good morning Dr. Rhea Belton  The following patient saw Bayley Mcmicheal and you signed off on her assessment. She wants to be a Chales Abrahams patient. Are you willing to release her? Please advise.

## 2023-07-11 NOTE — Telephone Encounter (Signed)
Good morning Dr. Chales Abrahams  The following patient is wishing to transfer her care to you. Are you willing to accept her as your patient? Please advise.

## 2023-07-13 ENCOUNTER — Emergency Department (HOSPITAL_COMMUNITY)
Admission: EM | Admit: 2023-07-13 | Discharge: 2023-07-13 | Disposition: A | Discharge status: Home / Self Care | Payer: BC Managed Care – PPO | Attending: Emergency Medicine | Admitting: Emergency Medicine

## 2023-07-13 ENCOUNTER — Encounter (HOSPITAL_COMMUNITY): Payer: Self-pay

## 2023-07-13 ENCOUNTER — Other Ambulatory Visit: Payer: Self-pay

## 2023-07-13 ENCOUNTER — Emergency Department (HOSPITAL_COMMUNITY): Payer: BC Managed Care – PPO

## 2023-07-13 DIAGNOSIS — R053 Chronic cough: Secondary | ICD-10-CM | POA: Diagnosis not present

## 2023-07-13 DIAGNOSIS — R0602 Shortness of breath: Secondary | ICD-10-CM | POA: Insufficient documentation

## 2023-07-13 DIAGNOSIS — R0789 Other chest pain: Secondary | ICD-10-CM | POA: Insufficient documentation

## 2023-07-13 LAB — BASIC METABOLIC PANEL
Anion gap: 8 (ref 5–15)
BUN: 16 mg/dL (ref 6–20)
CO2: 25 mmol/L (ref 22–32)
Calcium: 9.1 mg/dL (ref 8.9–10.3)
Chloride: 104 mmol/L (ref 98–111)
Creatinine, Ser: 0.82 mg/dL (ref 0.44–1.00)
GFR, Estimated: >60 mL/min (ref >60)
Glucose, Bld: 84 mg/dL (ref 70–99)
Potassium: 3.9 mmol/L (ref 3.5–5.1)
Sodium: 137 mmol/L (ref 135–145)

## 2023-07-13 LAB — TROPONIN I (HIGH SENSITIVITY)
Troponin I (High Sensitivity): <2 ng/L (ref <18)
Troponin I (High Sensitivity): 3 ng/L (ref <18)

## 2023-07-13 LAB — CBC
HCT: 38.8 % (ref 36.0–46.0)
Hemoglobin: 12.8 g/dL (ref 12.0–15.0)
MCH: 32.2 pg (ref 26.0–34.0)
MCHC: 33 g/dL (ref 30.0–36.0)
MCV: 97.7 fL (ref 80.0–100.0)
Platelets: 234 10*3/uL (ref 150–400)
RBC: 3.97 MIL/uL (ref 3.87–5.11)
RDW: 11.8 % (ref 11.5–15.5)
WBC: 8.4 10*3/uL (ref 4.0–10.5)
nRBC: 0 % (ref 0.0–0.2)

## 2023-07-13 LAB — HCG, SERUM, QUALITATIVE: Preg, Serum: NEGATIVE

## 2023-07-13 NOTE — ED Provider Triage Note (Signed)
Emergency Medicine Provider Triage Evaluation Note  Taylor Stevens , a 32 y.o. female  was evaluated in triage.  Pt complains of chest tightness x 2 today lasting for about 30 sec.  Review of Systems  Positive: Chest tightness  Negative:  fever  Physical Exam  BP 111/75   Pulse 89   Temp 98.4 F (36.9 C)   Resp 18   Ht 5\' 3"  (1.6 m)   Wt 70.3 kg   LMP 06/25/2023 (Approximate) Comment: pt recently had IUD removed, reports last menstrual ended 06/28/23  SpO2 100%   BMI 27.45 kg/m  Gen:   Awake, no distress   Resp:  Normal effort  MSK:   Moves extremities without difficulty  Other:    Medical Decision Making  Medically screening exam initiated at 3:37 PM.  Appropriate orders placed.  [name] was informed that the remainder of the evaluation will be completed by another provider, this initial triage assessment does not replace that evaluation, and the importance of remaining in the ED until their evaluation is complete.     Arthor Captain, PA-C 07/13/23 [phone]

## 2023-07-13 NOTE — ED Provider Notes (Signed)
[facility] AT [facility] Provider Note   CSN: [id number] Arrival date & time: 07/13/23  1529     History  Chief Complaint  Patient presents with   Shortness of Breath    ROSY Taylor Stevens is a 32 y.o. female with PMH as listed below who presents with chest tightness x 2 today lasting for about 30 sec. Has had chronic cough intermittently for ~1 year which she is seeing a gastroenterologist next week. Takes an antacid. Has never been tested for asthma. Denies CP, nausea/vomiting, diaphoresis, h/o DVT/PE, leg swelling, h/o recent hospitalizations/surgeries/immobilizations. Does not take oral birth control.     Past Medical History:  Diagnosis Date   Colitis    GAD (generalized anxiety disorder)    GERD (gastroesophageal reflux disease)    Guillain Barr syndrome (HCC)    Thyroid nodule        Home Medications Prior to Admission medications   Medication Sig Start Date End Date Taking? Authorizing Provider  ASHWAGANDHA PO Take 1 tablet by mouth daily.    [provider]  benzonatate (TESSALON) 100 MG capsule Take 1 capsule (100 mg total) by mouth every 8 (eight) hours. 06/27/23   Marita Kansas, PA-C  diphenhydrAMINE (BENADRYL) 25 MG tablet Take 1 tablet (25 mg total) by mouth every 6 (six) hours. 03/27/17 03/12/22  Shaune Pollack, MD  docusate sodium (COLACE) 100 MG capsule Take 1 capsule (100 mg total) by mouth every 12 (twelve) hours. 05/15/17   Arby Barrette, MD  doxycycline (VIBRAMYCIN) 100 MG capsule Take 1 capsule (100 mg total) by mouth 2 (two) times daily. 06/27/23   Karie Mainland, Amjad, PA-C  HYDROcodone bit-homatropine (HYCODAN) 5-1.5 MG/5ML syrup Take 5 mLs by mouth every 6 (six) hours as needed for cough. 06/27/23   Marita Kansas, PA-C  hydrOXYzine (ATARAX/VISTARIL) 25 MG tablet Take 1 tablet (25 mg total) by mouth at bedtime as needed. 11/08/19   Jeannie Fend, PA-C  Multiple Vitamins-Minerals (MULTIVITAMIN WITH MINERALS) tablet Take 1 tablet by  mouth daily.    [provider]  omeprazole (PRILOSEC) 20 MG capsule Take 1 capsule (20 mg total) by mouth daily. 08/26/22   Theron Arista, PA-C  ondansetron (ZOFRAN) 4 MG tablet Take 1 tablet (4 mg total) by mouth every 8 (eight) hours as needed for up to 20 doses for nausea or vomiting. 05/09/23   Arletha Pili, DO  sertraline (ZOLOFT) 50 MG tablet  05/04/23   [provider]  sucralfate (CARAFATE) 1 g tablet Take 1 tablet (1 g total) by mouth 4 (four) times daily -  with meals and at bedtime. 05/06/23   Gwyneth Sprout, MD      Allergies    Influenza virus vaccine, Shellfish allergy, and Bentyl [dicyclomine]    Review of Systems   Review of Systems A 10 point review of systems was performed and is negative unless otherwise reported in HPI.  Physical Exam Updated Vital Signs BP 95/63   Pulse 85   Temp 98.4 F (36.9 C)   Resp (!) 22   Ht 5\' 3"  (1.6 m)   Wt 70.3 kg   LMP 06/25/2023 (Approximate) Comment: pt recently had IUD removed, reports last menstrual ended 06/28/23  SpO2 100%   BMI 27.45 kg/m  Physical Exam General: Normal appearing female, lying in bed.  HEENT: Sclera anicteric, MMM, trachea midline.  Cardiology: RRR, no murmurs/rubs/gallops.  Resp: Normal respiratory rate and effort. CTAB, no wheezes, rhonchi, crackles.  Abd: Soft, non-tender, non-distended. No  rebound tenderness or guarding.  GU: Deferred. MSK: No peripheral edema or signs of trauma.  Skin: warm, dry.  Neuro: A&Ox4, CNs II-XII grossly intact. MAEs. Sensation grossly intact.  Psych: Normal mood and affect.   ED Results / Procedures / Treatments   Labs (all labs ordered are listed, but only abnormal results are displayed) Labs Reviewed  HCG, SERUM, QUALITATIVE  BASIC METABOLIC PANEL  CBC  TROPONIN I (HIGH SENSITIVITY)  TROPONIN I (HIGH SENSITIVITY)    EKG EKG Interpretation Date/Time:  Thursday July 13 2023 15:44:29 EDT Ventricular Rate:  91 PR Interval:  180 QRS  Duration:  68 QT Interval:  332 QTC Calculation: 408 R Axis:   63  Text Interpretation: Normal sinus rhythm Confirmed by Vivi Barrack [phone]) on 07/13/2023 5:35:07 PM  Radiology DG Chest 2 View  Result Date: 07/13/2023 CLINICAL DATA:  Cough and difficulty breathing.  Chest tightness EXAM: CHEST - 2 VIEW COMPARISON:  X-ray 05/11/2023 FINDINGS: The heart size and mediastinal contours are within normal limits. No consolidation, pneumothorax or effusion. No edema. The visualized skeletal structures are unremarkable. Slight curvature of the spine. IMPRESSION: No acute cardiopulmonary disease. Electronically Signed   By: Karen Kays M.D.   On: 07/13/2023 19:09    Procedures Procedures    Medications Ordered in ED Medications - No data to display  ED Course/ Medical Decision Making/ A&P                          Medical Decision Making   This patient presents to the ED for concern of episode of chest tightness/SOB, this involves an extensive number of treatment options, and is a complaint that carries with it a high risk of complications and morbidity.  I considered the following differential and admission for this acute, potentially life threatening condition. Pt is very well-appearing, HDS  MDM:    DDX for chest pain includes but is not limited to:  ACS/arrhythmia, PE, aortic dissection, PNA, PTX,  pericarditis, GERD/PUD/gastritis, or musculoskeletal pain. Very low suspicion for ACS vs aortic dissection given presenting sx. Patient PERCs out for PE and has minimal risk factors for PE. No abdominal pain and no c/f biliary disease. Consider GERD, given known history.   Clinical Course as of 07/13/23 1957  Thu Jul 13, 2023  1734 CBC, HCG neg [HN]  1957 Patient with very reassuring workup including BMP and troponin x 2.  EKG without concerning findings.  Chest x-ray within normal limits.  Heart score is low.  Patient feels asymptomatic since arrival to the emergency department.  She is  instructed to follow-up with her PCP within 1 to 2 weeks.  Also started follow-up with GI next week as originally scheduled for her chronic cough.  Discussed other workup she could do as an outpatient for her chronic cough.  Given discharge instructions and return precautions, all questions answered to patient satisfaction. [HN]    Clinical Course User Index [HN] Loetta Rough, MD    Labs: I Ordered, and personally interpreted labs.  The pertinent results include:  those listed above  Imaging Studies ordered: I ordered imaging studies including CXR I independently visualized and interpreted imaging. I agree with the radiologist interpretation  Additional history obtained from chart review  Cardiac Monitoring: The patient was maintained on a cardiac monitor.  I personally viewed and interpreted the cardiac monitored which showed an underlying rhythm of: NSR  Reevaluation: I reevaluated the patient and found that they have :  resolved  Social Determinants of Health: Lives independently  Disposition:  DC w/ discharge instructions/return precautions. All questions answered to patient's satisfaction.    Co morbidities that complicate the patient evaluation  Past Medical History:  Diagnosis Date   Colitis    GAD (generalized anxiety disorder)    GERD (gastroesophageal reflux disease)    Guillain Barr syndrome (HCC)    Thyroid nodule      Medicines No orders of the defined types were placed in this encounter.   I have reviewed the patients home medicines and have made adjustments as needed  Problem List / ED Course: Problem List Items Addressed This Visit   None Visit Diagnoses     SOB (shortness of breath)    -  Primary   Atypical chest pain                       This note was created using dictation software, which may contain spelling or grammatical errors.    Loetta Rough, MD 07/13/23 [phone]

## 2023-07-13 NOTE — ED Triage Notes (Signed)
PT c/o SOB started today. Pt states she walked into a store today and her chest tightened up and she couldn't  breathe for 30 sec then it went away. Pt denies SOB at this time. Pt denies any other sx.

## 2023-07-13 NOTE — Discharge Instructions (Signed)
Thank you for coming to [facility] Emergency Department. You were seen for shortness of breath and chest pain that resolved. We did an exam, labs, and imaging, and these showed no acute findings. You can alternate taking Tylenol and ibuprofen as needed for pain. You can take 650mg  tylenol (acetaminophen) every 4-6 hours, and 600 mg ibuprofen 3 times a day.  Please follow up with your primary care provider within 1 week.  Please see your gastroenterologist next week as originally scheduled.  You can continue workup with your PCP for your chronic cough.  This may include appointment with an allergist or pulmonologist for breathing testing.  Do not hesitate to return to the ED or call 911 if you experience: -Worsening symptoms -Lightheadedness, passing out -Fevers/chills -Anything else that concerns you

## 2023-07-20 ENCOUNTER — Ambulatory Visit: Payer: BC Managed Care – PPO | Admitting: Gastroenterology

## 2023-07-25 ENCOUNTER — Ambulatory Visit: Payer: BC Managed Care – PPO | Admitting: Gastroenterology

## 2023-07-27 NOTE — Telephone Encounter (Signed)
OK by me RG 

## 2023-08-23 ENCOUNTER — Telehealth: Payer: Self-pay | Admitting: Gastroenterology

## 2023-11-08 ENCOUNTER — Other Ambulatory Visit: Payer: Self-pay | Admitting: *Deleted

## 2023-11-08 ENCOUNTER — Telehealth: Payer: Self-pay | Admitting: *Deleted

## 2023-11-08 DIAGNOSIS — R935 Abnormal findings on diagnostic imaging of other abdominal regions, including retroperitoneum: Secondary | ICD-10-CM

## 2023-11-08 NOTE — Telephone Encounter (Signed)
-----   Message from Nurse Naomie RAMAN sent at 06/06/2023  9:44 AM EDT ----- MRI abdomen in 6 months (around 12/06/23) with and without Eovist  intravascular contrast for hx benign hepatic adenoma.  4. No definite lesion identified along segment V to correspond with the findings on prior CT this is favored to reflect a resolved focus of fatty infiltration. Suggest attention on follow-up MRI   [facility] Patient, see 06/03/23 imaging report for further info.

## 2023-11-08 NOTE — Telephone Encounter (Signed)
 Patient has been scheduled for MR abdomen w/wo contrast on 12/13/23 at 10 am, 930 am arrival; NPO 4 hours prior.  I have attempted to reach patient by phone but get no answer and no voicemail. Will attempt to reach out again at a later time.

## 2023-11-09 NOTE — Telephone Encounter (Signed)
 2nd attempt to reach patient by phone. No answer, no voicemail.  MyChart message also sent.

## 2023-11-13 NOTE — Telephone Encounter (Signed)
 3rd phone call attempt. No answer, no voicemail. Patient has not read her mychart message either.  Will make final attempt to reach patient by letter to her home address. If continued no response, we will cancel upcoming MRI until we hear back from her.

## 2023-12-01 NOTE — Telephone Encounter (Signed)
 Patient is advised of MRI 12/13/23. She agrees to have testing and verbalizes understanding of time/date/location/prep for test. Patient has multiple questions regarding whether the gallbladder will be seen on MRI (05/2023 MRI showed a gallbladder abnormality). Also questions if thyroid will be seen. Advised that any thyroid concerns should be presented to her PCP or endocrinologist.

## 2023-12-01 NOTE — Telephone Encounter (Signed)
 Inbound call from patient stating she is concerned about cyst on gallbladder. States she was advised of this around August of 2024. Patient is requesting a call back to discuss MRI further. Please advise, thank you.

## 2023-12-06 IMAGING — CR DG CHEST 2V
2 series · 2 of 2 positions shown · non-contrast
Comparison: None.

CLINICAL DATA: Shortness of breath

EXAM:
CHEST - 2 VIEW

[chest pa]
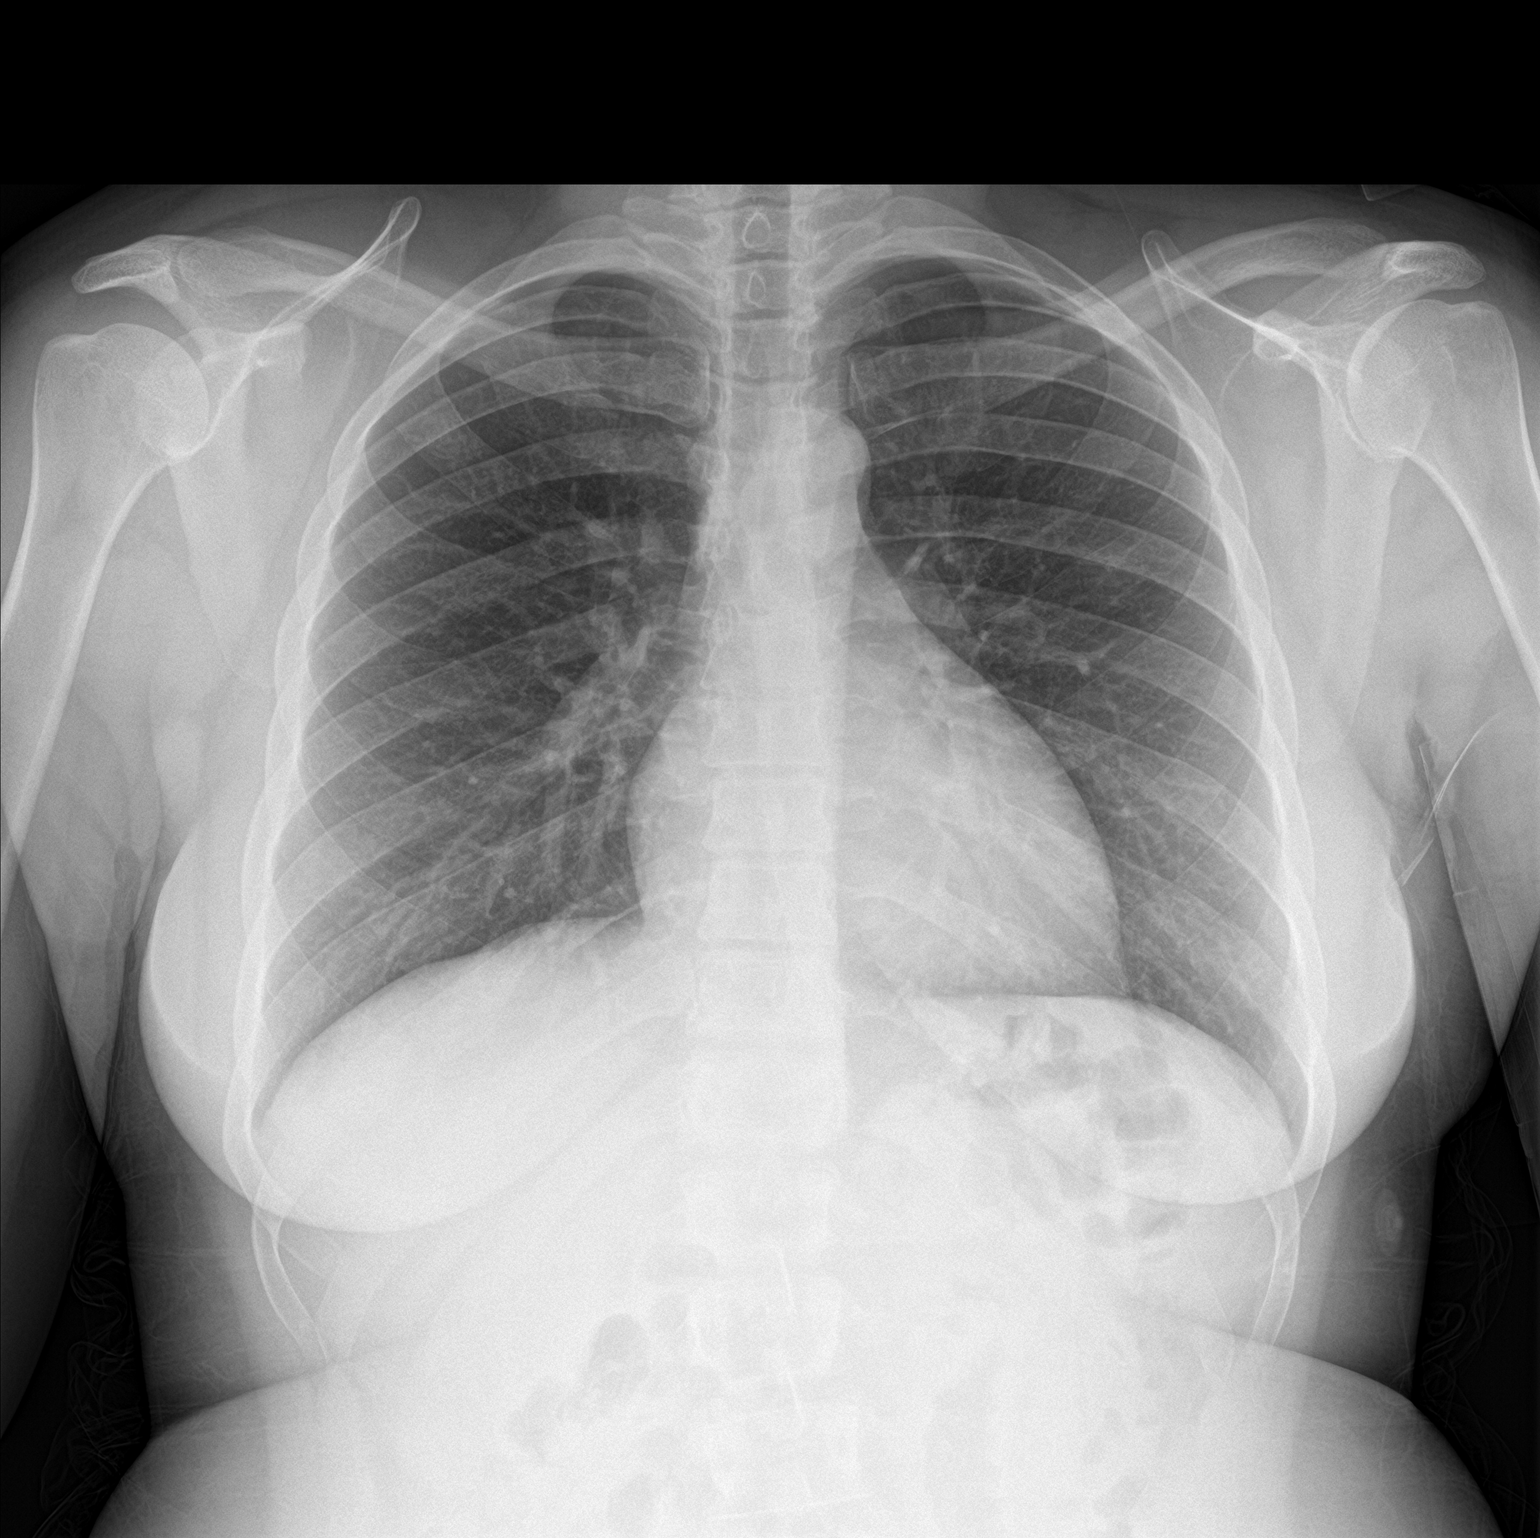

[chest lat]
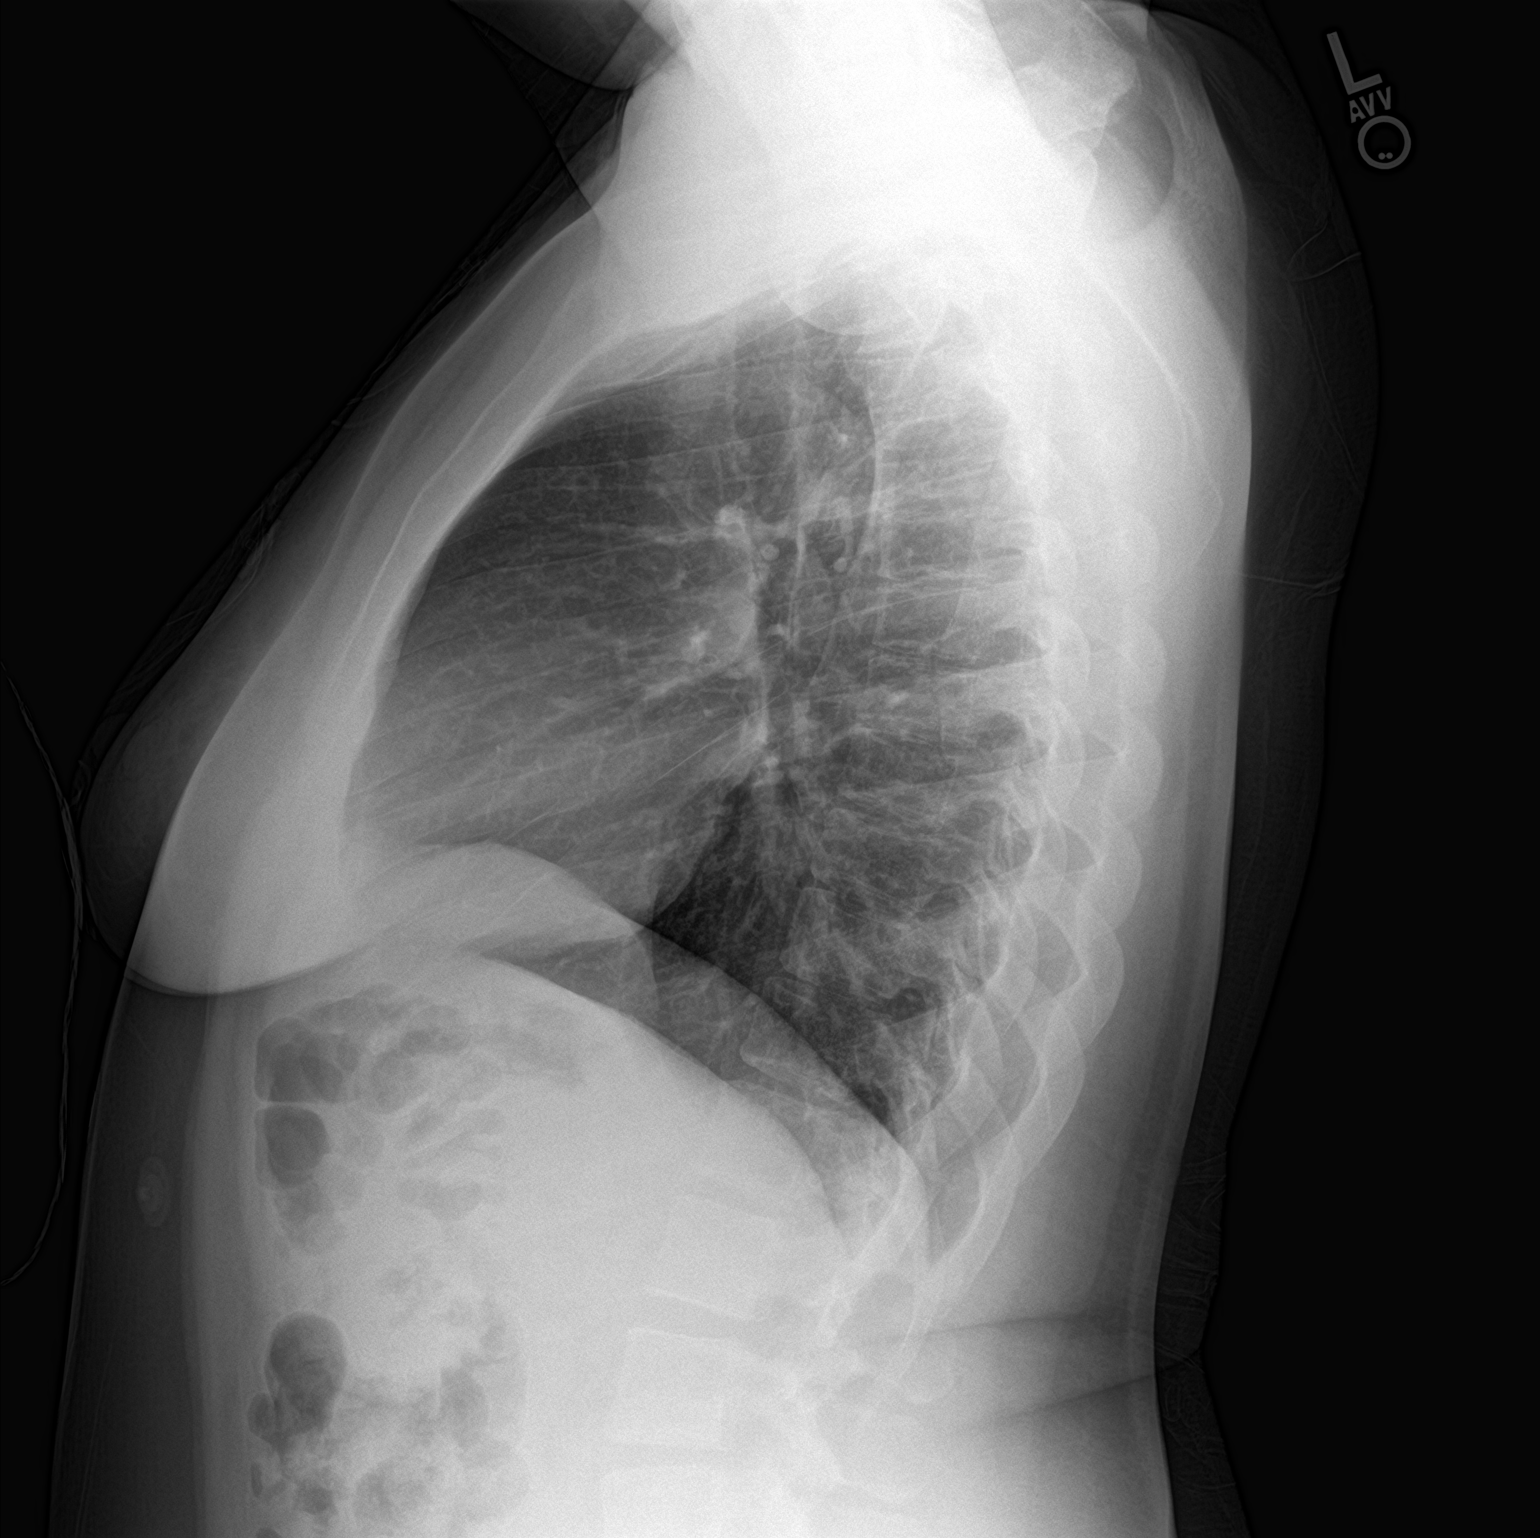

[2 of 2 positions shown; findings below may reference images not displayed]

FINDINGS: The heart size and mediastinal contours are within normal limits.
Both lungs are clear. The visualized skeletal structures are
unremarkable.
IMPRESSION: No active cardiopulmonary disease.

## 2023-12-13 ENCOUNTER — Ambulatory Visit (HOSPITAL_COMMUNITY): Payer: BC Managed Care – PPO

## 2023-12-14 ENCOUNTER — Ambulatory Visit (HOSPITAL_COMMUNITY): Admission: RE | Admit: 2023-12-14 | Payer: BC Managed Care – PPO | Source: Ambulatory Visit

## 2024-01-11 ENCOUNTER — Ambulatory Visit (HOSPITAL_COMMUNITY): Admission: RE | Admit: 2024-01-11 | Source: Ambulatory Visit

## 2024-01-26 ENCOUNTER — Telehealth: Payer: Self-pay | Admitting: Gastroenterology

## 2024-01-26 NOTE — Telephone Encounter (Signed)
 Patient stated that she would like to speak to the nurse due to her having more blood in her stool. Patient is wondering what she should weather she should wait after the MRI. Patient also is very nervous for being sedated. Patient is requesting a call back. Please advise.

## 2024-01-26 NOTE — Telephone Encounter (Signed)
 I have spoken to patient who says that she over the years, she has intermittently had rectal bleeding but it went away for some time. States over the last few days, she has noticed a small amount of brb at the end of her bowel movements which was concerning to her. She denies any abdominal pain, rectal pain. Does indicate that she had some issues with constipation a week or so ago. Patient also expresses extreme anxiety regarding the possibility of being sedated for a colonoscopy. We discussed propofol sedation and she is instructed to eat foods high in fiber, increase water intake to at least 64 ounces per day and add benefiber 1 teaspoon daily to help regulate bowel movements. Advised she may purchase some hydrocortisone cream OTC and apply to the rectum as needed for the next week or so. Patient is asked to call our office back should she continue having bleeding despite these adjustments. She states that she will do this.

## 2024-01-29 NOTE — Telephone Encounter (Signed)
 Patient is advised of Bayley's recommendation. She has scheduled colonoscopy with Dr [place] Gillis on 03/19/24 and telephone previsit on 03/05/24.

## 2024-01-29 NOTE — Telephone Encounter (Signed)
 Attempted to reach patient. No answer, no voicemail set up. Will attempt to reach out again at a later time.

## 2024-01-29 NOTE — Telephone Encounter (Signed)
 Patient calls back stating that over the weekend, she began having increasing amounts of blood following bowel movements. Describes blood as "thicker" and "kinda squirts out at the end." States that she has noticed lower abdominal "burning," worse in the mornings. Says she has tried hydrocortisone cream as directed on Friday, but only did this once as it caused some rectal burning. Patient continues to have anxiety that she may have cancer. Also indicates she spoke with her Aunt over the weekend about her symptoms and was told that both her PGF and paternal uncle had colon cancer.   Please advise on any additional recommendations for the patient.

## 2024-01-29 NOTE — Telephone Encounter (Signed)
 PT is calling to give an update on symptom. She stated that her symptoms are getting worse and she is asking if a colonoscopy can be scheduled immediately or if she will need to come in. She stated she was told that it could be scheduled directly. Please advise.

## 2024-02-10 ENCOUNTER — Inpatient Hospital Stay (HOSPITAL_COMMUNITY): Admission: RE | Admit: 2024-02-10 | Source: Ambulatory Visit

## 2024-02-11 ENCOUNTER — Ambulatory Visit (HOSPITAL_COMMUNITY): Admission: RE | Admit: 2024-02-11 | Source: Ambulatory Visit

## 2024-02-11 ENCOUNTER — Other Ambulatory Visit (HOSPITAL_COMMUNITY)

## 2024-02-18 ENCOUNTER — Ambulatory Visit (HOSPITAL_COMMUNITY): Admission: RE | Admit: 2024-02-18 | Source: Ambulatory Visit

## 2024-02-25 ENCOUNTER — Encounter (HOSPITAL_COMMUNITY): Payer: Self-pay | Admitting: Gastroenterology

## 2024-02-25 ENCOUNTER — Ambulatory Visit (HOSPITAL_COMMUNITY)
Admission: RE | Admit: 2024-02-25 | Discharge: 2024-02-25 | Disposition: A | Discharge status: Home / Self Care | Source: Ambulatory Visit | Attending: Gastroenterology | Admitting: Gastroenterology

## 2024-02-25 ENCOUNTER — Other Ambulatory Visit (HOSPITAL_COMMUNITY): Payer: Self-pay | Admitting: Gastroenterology

## 2024-02-25 DIAGNOSIS — R109 Unspecified abdominal pain: Secondary | ICD-10-CM

## 2024-02-25 DIAGNOSIS — R935 Abnormal findings on diagnostic imaging of other abdominal regions, including retroperitoneum: Secondary | ICD-10-CM | POA: Insufficient documentation

## 2024-02-25 DIAGNOSIS — R1084 Generalized abdominal pain: Secondary | ICD-10-CM

## 2024-02-25 MED ORDER — GADOXETATE DISODIUM 0.25 MMOL/ML IV SOLN
7.0000 mL | Freq: Once | INTRAVENOUS | Status: AC | PRN
  Administered 2024-02-25: 7 mL via INTRAVENOUS

## 2024-02-26 ENCOUNTER — Ambulatory Visit: Payer: Self-pay | Admitting: Gastroenterology

## 2024-03-05 ENCOUNTER — Encounter

## 2024-03-05 NOTE — Progress Notes (Unsigned)
No egg or soy allergy known to patient  No issues known to pt with past sedation with any surgeries or procedures Patient denies ever being told they had issues or difficulty with intubation  No FH of Malignant Hyperthermia Pt is not on diet pills Pt is not on  home 02  Pt is not on blood thinners  Pt denies issues with constipation  No A fib or A flutter Have any cardiac testing pending--*** Pt instructed to use [employer] or GoodRx for a price reduction on prep   

## 2024-03-12 ENCOUNTER — Ambulatory Visit (AMBULATORY_SURGERY_CENTER)

## 2024-03-12 VITALS — Ht 62.0 in | Wt 170.0 lb

## 2024-03-12 DIAGNOSIS — R935 Abnormal findings on diagnostic imaging of other abdominal regions, including retroperitoneum: Secondary | ICD-10-CM

## 2024-03-12 DIAGNOSIS — K625 Hemorrhage of anus and rectum: Secondary | ICD-10-CM

## 2024-03-12 MED ORDER — NA SULFATE-K SULFATE-MG SULF 17.5-3.13-1.6 GM/177ML PO SOLN
1.0000 | Freq: Once | ORAL | 0 refills | Status: AC
Start: 2024-03-12 — End: 2024-03-12

## 2024-03-12 NOTE — Progress Notes (Signed)
 Pre visit completed over telephone  Instructions mailed and sent through [facility], email, and mailed   No egg or soy allergy known to patient  No issues known to pt with past sedation with any surgeries or procedures Patient denies ever being told they had issues or difficulty with intubation  No FH of Malignant Hyperthermia Pt is not on diet pills Pt is not on  home 02  Pt is not on blood thinners  Pt denies issues with constipation  No A fib or A flutter Have any cardiac testing pending--NO Pt instructed to use [employer] or GoodRx for a price reduction on prep

## 2024-03-13 ENCOUNTER — Telehealth: Payer: Self-pay | Admitting: Gastroenterology

## 2024-03-13 NOTE — Telephone Encounter (Signed)
 Good afternoon Dr. [place] Gillis, I received a call from this patient stating that she is needing to reschedule her appointment for June the 17 th due to her starting school and not being able to miss class. Patient was rescheduled for August the 21 st.

## 2024-03-19 ENCOUNTER — Encounter: Admitting: Gastroenterology

## 2024-03-21 ENCOUNTER — Telehealth: Payer: Self-pay | Admitting: Gastroenterology

## 2024-03-21 NOTE — Telephone Encounter (Signed)
 Patient has rescheduled procedure. Please update prep instructions.

## 2024-03-21 NOTE — Telephone Encounter (Signed)
 Inbound call from patient requesting to know if colonoscopy is still needed. States she has not had rectal bleeding for about 3 weeks. Requesting a call back to discuss further. Please advise, thank you

## 2024-03-21 NOTE — Telephone Encounter (Signed)
 Patient states that since she began benefiber as instructed, she has not had any additional rectal bleeding. Wonders if she needs to keep scheduled colonoscopy.  Advised patient that while this is up to her, she has had some rectal bleeding issues in the past that stopped. Once it started back, she became very anxious and wanted colonoscopy for evaluation. Discussed that though likely from a hemorrhoidal source, it would be beneficial to make certain there are no colon abnormalities with colonoscopy so if bleeding recurs, we can say with certainty that everything was okay on colonoscopy. Advised that I think this will be helpful in calming her anxiety about recurrent bleeding as well. Patient states that she agrees with this and will plan to keep scheduled colonoscopy.

## 2024-03-22 ENCOUNTER — Telehealth: Payer: Self-pay | Admitting: Gastroenterology

## 2024-03-22 NOTE — Telephone Encounter (Signed)
 Good morning Dr. [place] Gillis, I received a call from this patient requesting to reschedule her June 26 th due to her having a scheduling conflict. Patient was rescheduled for August the 1 st.

## 2024-03-28 ENCOUNTER — Encounter: Admitting: Gastroenterology

## 2024-05-03 ENCOUNTER — Encounter: Admitting: Gastroenterology

## 2024-05-23 ENCOUNTER — Encounter: Admitting: Gastroenterology

## 2024-09-17 NOTE — Telephone Encounter (Signed)
 Delon will refax accomodation forms.

## 2024-09-28 ENCOUNTER — Encounter (HOSPITAL_COMMUNITY): Payer: Self-pay

## 2024-09-28 ENCOUNTER — Emergency Department (HOSPITAL_COMMUNITY)
Admission: EM | Admit: 2024-09-28 | Discharge: 2024-09-29 | Attending: Emergency Medicine | Admitting: Emergency Medicine

## 2024-09-28 ENCOUNTER — Other Ambulatory Visit: Payer: Self-pay

## 2024-09-28 DIAGNOSIS — R0602 Shortness of breath: Secondary | ICD-10-CM | POA: Insufficient documentation

## 2024-09-28 DIAGNOSIS — R000 Tachycardia, unspecified: Secondary | ICD-10-CM | POA: Diagnosis not present

## 2024-09-28 DIAGNOSIS — R059 Cough, unspecified: Secondary | ICD-10-CM | POA: Insufficient documentation

## 2024-09-28 DIAGNOSIS — R0789 Other chest pain: Secondary | ICD-10-CM | POA: Insufficient documentation

## 2024-09-28 DIAGNOSIS — Z5321 Procedure and treatment not carried out due to patient leaving prior to being seen by health care provider: Secondary | ICD-10-CM | POA: Insufficient documentation

## 2024-09-28 NOTE — ED Triage Notes (Signed)
 Pt to ED c/o flu positive, reports went to UC today and dx with the flu,  Reports intermittent CP and SHOB worse with cough.  Pt concerned because elevated HR, reports tachycardia started aprox 3 hours ago

## 2024-09-29 ENCOUNTER — Emergency Department (HOSPITAL_COMMUNITY)

## 2024-09-29 DIAGNOSIS — R0602 Shortness of breath: Secondary | ICD-10-CM | POA: Diagnosis not present

## 2024-09-29 LAB — CBC
HCT: 38.2 % (ref 36.0–46.0)
Hemoglobin: 13 g/dL (ref 12.0–15.0)
MCH: 32.8 pg (ref 26.0–34.0)
MCHC: 34 g/dL (ref 30.0–36.0)
MCV: 96.5 fL (ref 80.0–100.0)
Platelets: 211 K/uL (ref 150–400)
RBC: 3.96 MIL/uL (ref 3.87–5.11)
RDW: 11.4 % — ABNORMAL LOW (ref 11.5–15.5)
WBC: 6.4 K/uL (ref 4.0–10.5)
nRBC: 0 % (ref 0.0–0.2)

## 2024-09-29 LAB — TROPONIN T, HIGH SENSITIVITY: Troponin T High Sensitivity: <15 ng/L (ref 0–19)

## 2024-09-29 LAB — BASIC METABOLIC PANEL WITH GFR
Anion gap: 9 (ref 5–15)
BUN: 7 mg/dL (ref 6–20)
CO2: 22 mmol/L (ref 22–32)
Calcium: 8.6 mg/dL — ABNORMAL LOW (ref 8.9–10.3)
Chloride: 102 mmol/L (ref 98–111)
Creatinine, Ser: 0.87 mg/dL (ref 0.44–1.00)
GFR, Estimated: >60 mL/min
Glucose, Bld: 88 mg/dL (ref 70–99)
Potassium: 3.9 mmol/L (ref 3.5–5.1)
Sodium: 133 mmol/L — ABNORMAL LOW (ref 135–145)

## 2024-09-29 LAB — HCG, SERUM, QUALITATIVE: Preg, Serum: NEGATIVE

## 2024-09-29 NOTE — ED Notes (Signed)
Unable to locate in lobby.
